# Patient Record
Sex: Female | Born: 1950 | Race: White | Hispanic: No | Smoking: Former smoker
Health system: Southern US, Community
[De-identification: ages and names within clinical notes are randomized; demographics above are authoritative.]

## PROBLEM LIST (undated history)

## (undated) DIAGNOSIS — T4145XA Adverse effect of unspecified anesthetic, initial encounter: Secondary | ICD-10-CM

## (undated) DIAGNOSIS — T8859XA Other complications of anesthesia, initial encounter: Secondary | ICD-10-CM

## (undated) DIAGNOSIS — M8580 Other specified disorders of bone density and structure, unspecified site: Secondary | ICD-10-CM

## (undated) DIAGNOSIS — E785 Hyperlipidemia, unspecified: Secondary | ICD-10-CM

## (undated) HISTORY — PX: TONSILECTOMY/ADENOIDECTOMY WITH MYRINGOTOMY: SHX6125

## (undated) HISTORY — DX: Hyperlipidemia, unspecified: E78.5

## (undated) HISTORY — PX: TUBAL LIGATION: SHX77

## (undated) HISTORY — PX: NECK SURGERY: SHX720

## (undated) HISTORY — DX: Other specified disorders of bone density and structure, unspecified site: M85.80

---

## 2006-05-03 ENCOUNTER — Other Ambulatory Visit: Admission: RE | Admit: 2006-05-03 | Discharge: 2006-05-03 | Payer: Self-pay | Admitting: Family Medicine

## 2012-06-15 ENCOUNTER — Other Ambulatory Visit: Payer: Self-pay | Admitting: Family Medicine

## 2012-06-15 DIAGNOSIS — R1011 Right upper quadrant pain: Secondary | ICD-10-CM

## 2012-06-19 ENCOUNTER — Ambulatory Visit (HOSPITAL_COMMUNITY): Payer: BC Managed Care – PPO

## 2012-10-16 ENCOUNTER — Other Ambulatory Visit: Payer: Self-pay | Admitting: *Deleted

## 2012-10-16 MED ORDER — OMEPRAZOLE 20 MG PO CPDR: 20.0000 mg | DELAYED_RELEASE_CAPSULE | Freq: Every day | ORAL | Status: DC

## 2012-12-05 ENCOUNTER — Telehealth: Payer: Self-pay

## 2013-01-10 ENCOUNTER — Encounter: Payer: Self-pay | Admitting: Nurse Practitioner

## 2013-01-10 ENCOUNTER — Ambulatory Visit (INDEPENDENT_AMBULATORY_CARE_PROVIDER_SITE_OTHER): Payer: BC Managed Care – PPO | Admitting: Nurse Practitioner

## 2013-01-10 VITALS — BP 99/60 | HR 66 | Temp 97.4°F | Ht 64.0 in | Wt 150.0 lb

## 2013-01-10 DIAGNOSIS — Z0000 Encounter for general adult medical examination without abnormal findings: Secondary | ICD-10-CM

## 2013-01-10 LAB — POCT CBC
Granulocyte percent: 58.6 %G (ref 37–80)
Lymph, poc: 2.9 (ref 0.6–3.4)
MCH, POC: 32.2 pg — AB (ref 27–31.2)
MCHC: 35.5 g/dL — AB (ref 31.8–35.4)
MCV: 90.7 fL (ref 80–97)
Platelet Count, POC: 256 10*3/uL (ref 142–424)
RDW, POC: 13 %
WBC: 7.9 10*3/uL (ref 4.6–10.2)

## 2013-01-10 NOTE — Progress Notes (Signed)
  Subjective:    Patient ID: Erica [name], female    DOB: 1951-04-07, 62 y.o.   MRN: [id number]  HPI Patient here today for annual physical exam- She ois doing well - No complaints- No current medical problems- No meds    Review of Systems  Constitutional: Negative.   HENT: Negative.   Respiratory: Negative.   Cardiovascular: Negative.   Gastrointestinal: Negative.   Genitourinary: Negative.   Musculoskeletal: Negative.   Neurological: Negative.   Hematological: Negative.   Psychiatric/Behavioral: Negative.        Objective:   Physical Exam  Constitutional: She is oriented to person, place, and time. She appears well-developed and well-nourished.  HENT:  Nose: Nose normal.  Mouth/Throat: Oropharynx is clear and moist.  Eyes: EOM are normal.  Neck: Trachea normal, normal range of motion and full passive range of motion without pain. Neck supple. No JVD present. Carotid bruit is not present. No thyromegaly present.  Cardiovascular: Normal rate, regular rhythm, normal heart sounds and intact distal pulses.  Exam reveals no gallop and no friction rub.   No murmur heard. Pulmonary/Chest: Effort normal and breath sounds normal.  Abdominal: Soft. Bowel sounds are normal. She exhibits no distension and no mass. There is no tenderness.  Musculoskeletal: Normal range of motion.  Lymphadenopathy:    She has no cervical adenopathy.  Neurological: She is alert and oriented to person, place, and time. She has normal reflexes.  Skin: Skin is warm and dry.  Psychiatric: She has a normal mood and affect. Her behavior is normal. Judgment and thought content normal.     BP 99/60  Pulse 66  Temp(Src) 97.4 F (36.3 C) (Oral)  Ht 5\' 4"  (1.626 m)  Wt 150 lb (68.04 kg)  BMI 25.73 kg/m2      Assessment & Plan:  1. Annual physical exam *Diet and exercsie encouraged - POCT CBC - COMPLETE METABOLIC PANEL WITH GFR - NMR Lipoprofile with Lipids - Thyroid Panel With TSH - Vitamin D 25  hydroxy  Mary-Margaret Daphine Deutscher, FNP

## 2013-01-10 NOTE — Patient Instructions (Signed)

## 2013-01-11 LAB — COMPLETE METABOLIC PANEL WITH GFR
AST: 24 U/L (ref 0–37)
Alkaline Phosphatase: 81 U/L (ref 39–117)
BUN: 21 mg/dL (ref 6–23)
Creat: 0.79 mg/dL (ref 0.50–1.10)
GFR, Est Non African American: 81 mL/min
Glucose, Bld: 91 mg/dL (ref 70–99)
Potassium: 4.2 mEq/L (ref 3.5–5.3)
Total Bilirubin: 0.2 mg/dL — ABNORMAL LOW (ref 0.3–1.2)

## 2013-01-11 LAB — THYROID PANEL WITH TSH
Free Thyroxine Index: 2.4 (ref 1.0–3.9)
T3 Uptake: 32.7 % (ref 22.5–37.0)

## 2013-01-12 LAB — NMR LIPOPROFILE WITH LIPIDS
Cholesterol, Total: 226 mg/dL — ABNORMAL HIGH (ref <200)
HDL Size: 8.8 nm — ABNORMAL LOW (ref >=9.2)
HDL-C: 49 mg/dL (ref >=40)
LDL (calc): 151 mg/dL — ABNORMAL HIGH (ref <100)
LDL Particle Number: 2079 nmol/L — ABNORMAL HIGH (ref <1000)
LP-IR Score: 41 (ref <=45)
Triglycerides: 132 mg/dL (ref <150)
VLDL Size: 40.7 nm (ref <=46.6)

## 2013-01-15 ENCOUNTER — Telehealth: Payer: Self-pay | Admitting: Nurse Practitioner

## 2013-01-15 NOTE — Telephone Encounter (Signed)
Details left on vm for pt.

## 2013-10-26 ENCOUNTER — Encounter: Payer: Self-pay | Admitting: *Deleted

## 2014-01-15 ENCOUNTER — Encounter: Payer: Self-pay | Admitting: Nurse Practitioner

## 2014-01-15 ENCOUNTER — Ambulatory Visit (INDEPENDENT_AMBULATORY_CARE_PROVIDER_SITE_OTHER): Payer: BC Managed Care – PPO | Admitting: Nurse Practitioner

## 2014-01-15 VITALS — BP 120/94 | HR 65 | Temp 98.1°F | Ht 64.0 in | Wt 156.6 lb

## 2014-01-15 DIAGNOSIS — Z13820 Encounter for screening for osteoporosis: Secondary | ICD-10-CM

## 2014-01-15 DIAGNOSIS — K219 Gastro-esophageal reflux disease without esophagitis: Secondary | ICD-10-CM

## 2014-01-15 DIAGNOSIS — E785 Hyperlipidemia, unspecified: Secondary | ICD-10-CM

## 2014-01-15 DIAGNOSIS — Z0000 Encounter for general adult medical examination without abnormal findings: Secondary | ICD-10-CM

## 2014-01-15 MED ORDER — OMEPRAZOLE 20 MG PO CPDR: 20.0000 mg | DELAYED_RELEASE_CAPSULE | Freq: Every day | ORAL | Status: DC

## 2014-01-15 NOTE — Patient Instructions (Signed)

## 2014-01-15 NOTE — Progress Notes (Signed)
Subjective:    Patient ID: Erica [name], female    DOB: [birth year], 63 y.o.   MRN: [id number]  Patient here tiday for CPE no pap- She is doing well today without complaints.  Hyperlipidemia This is a chronic problem. The current episode started more than 1 year ago. The problem is controlled. There are no known factors aggravating her hyperlipidemia. Current antihyperlipidemic treatment includes fibric acid derivatives and diet change. The current treatment provides mild improvement of lipids. There are no compliance problems.  Risk factors for coronary artery disease include post-menopausal.  GERD: Patient is taking prilosec 80m daily which she reports is helping.     Review of Systems  Constitutional: Negative.   HENT: Negative.   Eyes: Negative.   Respiratory: Negative.   Cardiovascular: Negative.   Gastrointestinal: Negative.   Endocrine: Negative.   Genitourinary: Negative.   Musculoskeletal: Negative.   Skin: Negative.   Allergic/Immunologic: Negative.   Neurological: Negative.   Hematological: Negative.   Psychiatric/Behavioral: Negative.        Objective:   Physical Exam  Constitutional: She is oriented to person, place, and time. She appears well-developed and well-nourished.  HENT:  Head: Normocephalic.  Eyes: Pupils are equal, round, and reactive to light.  Neck: Normal range of motion.  Cardiovascular: Normal rate.   Pulmonary/Chest: Effort normal.  Abdominal: Soft.  Musculoskeletal: Normal range of motion.  Neurological: She is alert and oriented to person, place, and time.  Skin: Skin is warm.  Psychiatric: She has a normal mood and affect.   BP 120/94  Pulse 65  Temp(Src) 98.1 F (36.7 C) (Oral)  Ht _0  (1.626 m)  Wt 156 lb 9.6 oz (71.033 kg)  BMI 26.87 kg/m2        Assessment & Plan:   1. Gastroesophageal reflux disease without esophagitis   2. Annual physical exam   3. Other and unspecified hyperlipidemia   4. Screening for  osteoporosis    Orders Placed This Encounter  Procedures  . DG Bone Density    Standing Status: Future     Number of Occurrences:      Standing Expiration Date: 03/17/2015    Order Specific Question:  Reason for Exam (SYMPTOM  OR DIAGNOSIS REQUIRED)    Answer:  screening    Order Specific Question:  Preferred imaging location?    Answer:  Internal  . NMR, lipoprofile  . CMP14+EGFR  . CBC With differential/Platelet  . Thyroid Panel With TSH   Meds ordered this encounter  Medications  . DISCONTD: omeprazole (PRILOSEC) 20 MG capsule    Sig: Take 20 mg by mouth daily.  . calcium citrate-vitamin D (CITRACAL+D) 315-200 MG-UNIT per tablet    Sig: Take 1 tablet by mouth 2 (two) times daily.  .Marland Kitchenaspirin 81 MG tablet    Sig: Take 81 mg by mouth daily.  . Ascorbic Acid (VITAMIN C) 1000 MG tablet    Sig: Take 1,000 mg by mouth daily.  . cholecalciferol (VITAMIN D) 400 UNITS TABS tablet    Sig: Take 400 Units by mouth.  .Marland Kitchenomeprazole (PRILOSEC) 20 MG capsule    Sig: Take 1 capsule (20 mg total) by mouth daily.    Dispense:  90 capsule    Refill:  1    Order Specific Question:  Supervising Provider    Answer:  MChipper Herb[[id number]]   hemoccult cards given to patient with directions Continue all  Health maintenance reviewed Follow up  In 6 Months  Mary-Margaret Hassell Done, FNP

## 2014-01-16 ENCOUNTER — Other Ambulatory Visit: Payer: Self-pay | Admitting: Nurse Practitioner

## 2014-01-16 ENCOUNTER — Telehealth: Payer: Self-pay | Admitting: Family Medicine

## 2014-01-16 LAB — CBC WITH DIFFERENTIAL
Basophils Absolute: 0 10*3/uL (ref 0.0–0.2)
Basos: 0 %
EOS: 2 %
Eosinophils Absolute: 0.2 10*3/uL (ref 0.0–0.4)
HEMATOCRIT: 41.5 % (ref 34.0–46.6)
Hemoglobin: 14.1 g/dL (ref 11.1–15.9)
IMMATURE GRANULOCYTES: 0 %
Immature Grans (Abs): 0 10*3/uL (ref 0.0–0.1)
LYMPHS ABS: 2 10*3/uL (ref 0.7–3.1)
Lymphs: 25 %
MCH: 30.8 pg (ref 26.6–33.0)
MCHC: 34 g/dL (ref 31.5–35.7)
MCV: 91 fL (ref 79–97)
MONOS ABS: 0.7 10*3/uL (ref 0.1–0.9)
Monocytes: 9 %
Neutrophils Absolute: 4.9 10*3/uL (ref 1.4–7.0)
Neutrophils Relative %: 64 %
PLATELETS: 295 10*3/uL (ref 150–379)
RBC: 4.58 x10E6/uL (ref 3.77–5.28)
RDW: 13.8 % (ref 12.3–15.4)
WBC: 7.7 10*3/uL (ref 3.4–10.8)

## 2014-01-16 LAB — CMP14+EGFR
ALBUMIN: 4.4 g/dL (ref 3.6–4.8)
ALT: 33 IU/L — ABNORMAL HIGH (ref 0–32)
AST: 31 IU/L (ref 0–40)
Albumin/Globulin Ratio: 2 (ref 1.1–2.5)
Alkaline Phosphatase: 83 IU/L (ref 39–117)
BILIRUBIN TOTAL: 0.4 mg/dL (ref 0.0–1.2)
BUN/Creatinine Ratio: 15 (ref 11–26)
BUN: 11 mg/dL (ref 8–27)
CO2: 27 mmol/L (ref 18–29)
CREATININE: 0.74 mg/dL (ref 0.57–1.00)
Calcium: 9.6 mg/dL (ref 8.7–10.3)
Chloride: 101 mmol/L (ref 97–108)
GFR calc non Af Amer: 87 mL/min/{1.73_m2} (ref >59)
GFR, EST AFRICAN AMERICAN: 100 mL/min/{1.73_m2} (ref >59)
GLOBULIN, TOTAL: 2.2 g/dL (ref 1.5–4.5)
Glucose: 89 mg/dL (ref 65–99)
Potassium: 4.6 mmol/L (ref 3.5–5.2)
Sodium: 140 mmol/L (ref 134–144)
TOTAL PROTEIN: 6.6 g/dL (ref 6.0–8.5)

## 2014-01-16 LAB — NMR, LIPOPROFILE
Cholesterol: 230 mg/dL — ABNORMAL HIGH (ref 100–199)
HDL CHOLESTEROL BY NMR: 55 mg/dL (ref >39)
HDL Particle Number: 35.6 umol/L (ref >=30.5)
LDL PARTICLE NUMBER: 1840 nmol/L — AB (ref <1000)
LDL Size: 21.4 nm (ref >20.5)
LDLC SERPL CALC-MCNC: 153 mg/dL — AB (ref 0–99)
LP-IR Score: 47 — ABNORMAL HIGH (ref <=45)
Small LDL Particle Number: 426 nmol/L (ref <=527)
TRIGLYCERIDES BY NMR: 110 mg/dL (ref 0–149)

## 2014-01-16 LAB — THYROID PANEL WITH TSH
FREE THYROXINE INDEX: 1.8 (ref 1.2–4.9)
T3 UPTAKE RATIO: 30 % (ref 24–39)
T4 TOTAL: 6.1 ug/dL (ref 4.5–12.0)
TSH: 4.17 u[IU]/mL (ref 0.450–4.500)

## 2014-01-16 MED ORDER — ATORVASTATIN CALCIUM 40 MG PO TABS: 40.0000 mg | ORAL_TABLET | Freq: Every day | ORAL | Status: DC

## 2014-01-16 NOTE — Telephone Encounter (Signed)
Message copied by Waverly Ferrari on Wed Jan 16, 2014  2:47 PM ------      Message from: Chevis Pretty      Created: Wed Jan 16, 2014  1:22 PM       ldl extremely- needs to be on statin-  What symptoms did have with last statin?      Kidney and liver function stable      Anemia panel normal      Thyroid normal      Continue current meds- low fat diet and exercise and recheck in 3 months             ------

## 2014-01-17 ENCOUNTER — Telehealth: Payer: Self-pay | Admitting: Family Medicine

## 2014-01-17 NOTE — Telephone Encounter (Signed)
Message copied by Waverly Ferrari on Thu Jan 17, 2014  2:45 PM ------      Message from: Chevis Pretty      Created: Thu Jan 17, 2014  1:13 PM       Strict low fat diet and exercise if can't tolerate statin ------

## 2014-02-13 ENCOUNTER — Ambulatory Visit: Payer: BC Managed Care – PPO

## 2014-02-13 ENCOUNTER — Ambulatory Visit (INDEPENDENT_AMBULATORY_CARE_PROVIDER_SITE_OTHER): Payer: BC Managed Care – PPO

## 2014-02-13 DIAGNOSIS — Z13820 Encounter for screening for osteoporosis: Secondary | ICD-10-CM

## 2014-02-13 LAB — HM DEXA SCAN

## 2014-03-11 ENCOUNTER — Encounter: Payer: Self-pay | Admitting: Pharmacist

## 2014-03-11 ENCOUNTER — Ambulatory Visit (INDEPENDENT_AMBULATORY_CARE_PROVIDER_SITE_OTHER): Payer: BC Managed Care – PPO | Admitting: Pharmacist

## 2014-03-11 VITALS — BP 122/82 | HR 70 | Ht 63.5 in | Wt 158.0 lb

## 2014-03-11 DIAGNOSIS — M899 Disorder of bone, unspecified: Secondary | ICD-10-CM

## 2014-03-11 DIAGNOSIS — M949 Disorder of cartilage, unspecified: Secondary | ICD-10-CM

## 2014-03-11 DIAGNOSIS — M8580 Other specified disorders of bone density and structure, unspecified site: Secondary | ICD-10-CM

## 2014-03-11 NOTE — Patient Instructions (Signed)
Exercise for Strong Bones  Exercise is important to build and maintain strong bones / bone density.  There are 2 types of exercises that are important to building and maintaining strong bones:  Weight- bearing and muscle-stregthening.  Weight-bearing Exercises  These exercises include activities that make you move against gravity while staying upright. Weight-bearing exercises can be high-impact or low-impact.  High-impact weight-bearing exercises help build bones and keep them strong. If you have broken a bone due to osteoporosis or are at risk of breaking a bone, you may need to avoid high-impact exercises. If you're not sure, you should check with your healthcare provider.  Examples of high-impact weight-bearing exercises are: Dancing  Doing high-impact aerobics  Hiking  Jogging/running  Jumping Rope  Stair climbing  Tennis  Low-impact weight-bearing exercises can also help keep bones strong and are a safe alternative if you cannot do high-impact exercises.   Examples of low-impact weight-bearing exercises are: Using elliptical training machines  Doing low-impact aerobics  Using stair-step machines  Fast walking on a treadmill or outside   Muscle-Strengthening Exercises These exercises include activities where you move your body, a weight or some other resistance against gravity. They are also known as resistance exercises and include: Lifting weights  Using elastic exercise bands  Using weight machines  Lifting your own body weight  Functional movements, such as standing and rising up on your toes  Yoga and Pilates can also improve strength, balance and flexibility. However, certain positions may not be safe for people with osteoporosis or those at increased risk of broken bones. For example, exercises that have you bend forward may increase the chance of breaking a bone in the spine.   Non-Impact Exercises There are other types of exercises that can help prevent falls.   Non-impact exercises can help you to improve balance, posture and how well you move in everyday activities. Some of these exercises include: Balance exercises that strengthen your legs and test your balance, such as Tai Chi, can decrease your risk of falls.  Posture exercises that improve your posture and reduce rounded or "sloping" shoulders can help you decrease the chance of breaking a bone, especially in the spine.  Functional exercises that improve how well you move can help you with everyday activities and decrease your chance of falling and breaking a bone. For example, if you have trouble getting up from a chair or climbing stairs, you should do these activities as exercises.   **A physical therapist can teach you balance, posture and functional exercises. He/she can also help you learn which exercises are safe and appropriate for you.  [facility] has a physical therapy office in [place] in front of our office and referrals can be made for assessments and treatment as needed and strength and balance training.  If you would like to have an assessment with [facility] and our physical therapy team please let a nurse or provider know.    

## 2014-03-11 NOTE — Progress Notes (Signed)
Osteoporosis Clinic Current Height: Height: 5' 3.5" (161.3 cm)      Max Lifetime Height:  5\' 4"  Current Weight: Weight: 158 lb (71.668 kg)       Ethnicity:Caucasian   HPI: Does pt already have a diagnosis of:  Osteopenia?  Yes Osteoporosis?  No  Back Pain?  No       Kyphosis?  No Prior fracture?  No Med(s) for Osteoporosis/Osteopenia:  none Med(s) previously tried for Osteoporosis/Osteopenia:  none                                                             PMH: Age at menopause:  35 or 63yo Hysterectomy?  No Oophorectomy?  No HRT? No Steroid Use?  No Thyroid med?  No History of cancer?  Yes - skin cancer History of digestive disorders (ie Crohn's)?  Yes - GERD on chronic PPI Current or previous eating disorders?  No Last Vitamin D Result:  53 (12/2012) Last GFR Result:  87 (12/2013)   FH/SH: Family history of osteoporosis?  Yes - mother Parent with history of hip fracture?  No Family history of breast cancer?  No Exercise?  No Smoking?  Not currently quit in 2009 Alcohol?  No    Calcium Assessment Calcium Intake  # of servings/day  Calcium mg  Milk (8 oz) 0  x  300  = 0  Yogurt (4 oz) 0 x  200 = 0  Cheese (1 oz) 1 x  200 = 200mg   Other Calcium sources   250mg   Ca supplement 500mg  qd = 500mg    Estimated calcium intake per day 950mg     DEXA Results Date of Test T-Score for AP Spine L1-L4 T-Score for Total Left Hip T-Score for Total Right Hip  02/13/2014 0.1 -1.6 -1.8  11/11/2010 -0.1 -1.4 -1.3  09/06/2007 -0.1 -1.1 -1.4        FRAX 10 year estimate: Total FX risk:  9.6%  (consider medication if >/= 20%) Hip FX risk:  1.1%  (consider medication if >/= 3%)  Assessment: Osteopenia with decreased BMD in hips  Recommendations: 1.  Discussed BMD results and fracture risk 2.  recommend calcium 1200mg  daily through supplementation or diet.  3.  recommend weight bearing exercise - 30 minutes at least 4 days per week.  Patient to look into exercise at work or  with daughter. 4.  Counseled and educated about fall risk and prevention.  Recheck DEXA:  2 years  Time spent counseling patient:  20 minutes

## 2014-03-19 ENCOUNTER — Other Ambulatory Visit: Payer: Self-pay | Admitting: Nurse Practitioner

## 2014-04-10 ENCOUNTER — Telehealth: Payer: Self-pay | Admitting: Nurse Practitioner

## 2014-04-10 DIAGNOSIS — K8012 Calculus of gallbladder with acute and chronic cholecystitis without obstruction: Secondary | ICD-10-CM

## 2014-04-10 NOTE — Telephone Encounter (Signed)
You saw her on 01-18-14. Does she need to come back in or can referral be made. Please advise

## 2014-04-11 ENCOUNTER — Encounter: Payer: Self-pay | Admitting: Family Medicine

## 2014-04-11 ENCOUNTER — Encounter (INDEPENDENT_AMBULATORY_CARE_PROVIDER_SITE_OTHER): Payer: Self-pay

## 2014-04-11 ENCOUNTER — Ambulatory Visit (INDEPENDENT_AMBULATORY_CARE_PROVIDER_SITE_OTHER): Payer: BC Managed Care – PPO | Admitting: Family Medicine

## 2014-04-11 VITALS — BP 133/73 | HR 64 | Temp 97.7°F | Ht 63.5 in | Wt 153.8 lb

## 2014-04-11 DIAGNOSIS — K8000 Calculus of gallbladder with acute cholecystitis without obstruction: Secondary | ICD-10-CM

## 2014-04-11 NOTE — Telephone Encounter (Signed)
Patient aware.

## 2014-04-11 NOTE — Progress Notes (Signed)
   Subjective:    Patient ID: Erica [name], female    DOB: February 08, 1951, 63 y.o.   MRN: [id number]  HPI C/o gallbladder attack on 04/09/14 and was seen in the ED at [facility].  She had elevated LFT's, and cholelithiasis w/o cholecystitis.  She ate a hot dog earlier that day and that kicked off colicky RUQ abdominal pain.  She did have a UTI and is on abx's.   Review of Systems    No chest pain, SOB, HA, dizziness, vision change, N/V, diarrhea, constipation, dysuria, urinary urgency or frequency, myalgias, arthralgias or rash.  Objective:   Physical Exam  Vital signs noted  Well developed well nourished female.  HEENT - Head atraumatic Normocephalic Respiratory - Lungs CTA bilateral Cardiac - RRR S1 and S2 without murmur GI - Abdomen soft Nontender and bowel sounds active x 4 Extremities - No edema. Neuro - Grossly intact.      Assessment & Plan:  Calculus of gallbladder with acute cholecystitis without obstruction - Plan: Ambulatory referral to [facility] FNP

## 2014-04-11 NOTE — Telephone Encounter (Signed)
Referral made 

## 2014-05-08 NOTE — Pre-Procedure Instructions (Signed)
SHARMANE DAME  05/08/2014   Your procedure is scheduled on: Tuesday, May 14, 2014   Report to [facility] Admitting at 10:30 AM.   Call this number if you have problems the morning of surgery: [phone]   Remember:   Do not eat food or drink liquids after midnight Monday, May 13, 2014    Take these medicines the morning of surgery with A SIP OF WATER: if needed:HYDROcodone for pain  Stop taking Aspirin, vitamins, and herbal medications (Flax seed oil, Krill oil).  Do not take any NSAIDs ie: Ibuprofen, Advil, Naproxen or any medication containing Aspirin.   Do not wear jewelry, make-up or nail polish.  Do not wear lotions, powders, or perfumes. You may not wear deodorant.  Do not shave 48 hours prior to surgery.   Do not bring valuables to the hospital.  [facility] is not responsible for any belongings or valuables.               Contacts, dentures or bridgework may not be worn into surgery.  Leave suitcase in the car. After surgery it may be brought to your room.  For patients admitted to the hospital, discharge time is determined by your treatment team.               Patients discharged the day of surgery will not be allowed to drive home.   Name and phone number of your driver:    Special Instructions:  Special Instructions:Special Instructions: [facility] - Preparing for Surgery  Before surgery, you can play an important role.  Because skin is not sterile, your skin needs to be as free of germs as possible.  You can reduce the number of germs on you skin by washing with CHG (chlorahexidine gluconate) soap before surgery.  CHG is an antiseptic cleaner which kills germs and bonds with the skin to continue killing germs even after washing.  Please DO NOT use if you have an allergy to CHG or antibacterial soaps.  If your skin becomes reddened/irritated stop using the CHG and inform your nurse when you arrive at Short Stay.  Do not shave (including legs  and underarms) for at least 48 hours prior to the first CHG shower.  You may shave your face.  Please follow these instructions carefully:   1.  Shower with CHG Soap the night before surgery and the morning of Surgery.  2.  If you choose to wash your hair, wash your hair first as usual with your normal shampoo.  3.  After you shampoo, rinse your hair and body thoroughly to remove the Shampoo.  4.  Use CHG as you would any other liquid soap.  You can apply chg directly  to the skin and wash gently with scrungie or a clean washcloth.  5.  Apply the CHG Soap to your body ONLY FROM THE NECK DOWN.  Do not use on open wounds or open sores.  Avoid contact with your eyes, ears, mouth and genitals (private parts).  Wash genitals (private parts) with your normal soap.  6.  Wash thoroughly, paying special attention to the area where your surgery will be performed.  7.  Thoroughly rinse your body with warm water from the neck down.  8.  DO NOT shower/wash with your normal soap after using and rinsing off the CHG Soap.  9.  Pat yourself dry with a clean towel.            10.  Wear clean pajamas.            11.  Place clean sheets on your bed the night of your first shower and do not sleep with pets.  Day of Surgery  Do not apply any lotions/deodorants the morning of surgery.  Please wear clean clothes to the hospital/surgery center.   Please read over the following fact sheets that you were given: Pain Booklet, Coughing and Deep Breathing and Surgical Site Infection Prevention

## 2014-05-09 ENCOUNTER — Encounter (HOSPITAL_COMMUNITY)
Admission: RE | Admit: 2014-05-09 | Discharge: 2014-05-09 | Disposition: A | Discharge status: Home / Self Care | Payer: BC Managed Care – PPO | Source: Ambulatory Visit | Attending: General Surgery | Admitting: General Surgery

## 2014-05-09 ENCOUNTER — Encounter (HOSPITAL_COMMUNITY): Payer: Self-pay

## 2014-05-09 ENCOUNTER — Other Ambulatory Visit (INDEPENDENT_AMBULATORY_CARE_PROVIDER_SITE_OTHER): Payer: Self-pay | Admitting: General Surgery

## 2014-05-09 DIAGNOSIS — R7989 Other specified abnormal findings of blood chemistry: Secondary | ICD-10-CM | POA: Diagnosis not present

## 2014-05-09 DIAGNOSIS — K8010 Calculus of gallbladder with chronic cholecystitis without obstruction: Secondary | ICD-10-CM | POA: Diagnosis not present

## 2014-05-09 DIAGNOSIS — Z01812 Encounter for preprocedural laboratory examination: Secondary | ICD-10-CM | POA: Insufficient documentation

## 2014-05-09 HISTORY — DX: Adverse effect of unspecified anesthetic, initial encounter: T41.45XA

## 2014-05-09 HISTORY — DX: Other complications of anesthesia, initial encounter: T88.59XA

## 2014-05-09 LAB — CBC
HEMATOCRIT: 42.2 % (ref 36.0–46.0)
HEMOGLOBIN: 14.5 g/dL (ref 12.0–15.0)
MCH: 30.7 pg (ref 26.0–34.0)
MCHC: 34.4 g/dL (ref 30.0–36.0)
MCV: 89.2 fL (ref 78.0–100.0)
Platelets: 299 10*3/uL (ref 150–400)
RBC: 4.73 MIL/uL (ref 3.87–5.11)
RDW: 13 % (ref 11.5–15.5)
WBC: 6.5 10*3/uL (ref 4.0–10.5)

## 2014-05-13 MED ORDER — CEFOXITIN SODIUM 2 G IV SOLR
2.0000 g | INTRAVENOUS | Status: AC
  Administered 2014-05-14: 2 g via INTRAVENOUS
  Filled 2014-05-13 (×2): qty 2

## 2014-05-14 ENCOUNTER — Ambulatory Visit (HOSPITAL_COMMUNITY)
Admission: RE | Admit: 2014-05-14 | Discharge: 2014-05-14 | Disposition: A | Discharge status: Home / Self Care | Payer: BC Managed Care – PPO | Source: Ambulatory Visit | Attending: General Surgery | Admitting: General Surgery

## 2014-05-14 ENCOUNTER — Ambulatory Visit (HOSPITAL_COMMUNITY): Payer: BC Managed Care – PPO | Admitting: Anesthesiology

## 2014-05-14 ENCOUNTER — Encounter (HOSPITAL_COMMUNITY)
Admission: RE | Disposition: A | Discharge status: Home / Self Care | Payer: Self-pay | Source: Ambulatory Visit | Attending: General Surgery

## 2014-05-14 ENCOUNTER — Ambulatory Visit (HOSPITAL_COMMUNITY): Payer: BC Managed Care – PPO

## 2014-05-14 ENCOUNTER — Encounter (HOSPITAL_COMMUNITY): Payer: Self-pay | Admitting: *Deleted

## 2014-05-14 DIAGNOSIS — Z888 Allergy status to other drugs, medicaments and biological substances status: Secondary | ICD-10-CM | POA: Diagnosis not present

## 2014-05-14 DIAGNOSIS — E780 Pure hypercholesterolemia: Secondary | ICD-10-CM | POA: Diagnosis not present

## 2014-05-14 DIAGNOSIS — K8010 Calculus of gallbladder with chronic cholecystitis without obstruction: Secondary | ICD-10-CM | POA: Diagnosis not present

## 2014-05-14 DIAGNOSIS — Z419 Encounter for procedure for purposes other than remedying health state, unspecified: Secondary | ICD-10-CM

## 2014-05-14 DIAGNOSIS — R7989 Other specified abnormal findings of blood chemistry: Secondary | ICD-10-CM | POA: Diagnosis not present

## 2014-05-14 DIAGNOSIS — K219 Gastro-esophageal reflux disease without esophagitis: Secondary | ICD-10-CM | POA: Insufficient documentation

## 2014-05-14 DIAGNOSIS — Z87891 Personal history of nicotine dependence: Secondary | ICD-10-CM | POA: Diagnosis not present

## 2014-05-14 DIAGNOSIS — K8020 Calculus of gallbladder without cholecystitis without obstruction: Secondary | ICD-10-CM | POA: Diagnosis present

## 2014-05-14 HISTORY — PX: CHOLECYSTECTOMY: SHX55

## 2014-05-14 LAB — COMPREHENSIVE METABOLIC PANEL
ALT: 22 U/L (ref 0–35)
ANION GAP: 12 (ref 5–15)
AST: 23 U/L (ref 0–37)
Albumin: 3.7 g/dL (ref 3.5–5.2)
Alkaline Phosphatase: 84 U/L (ref 39–117)
BUN: 16 mg/dL (ref 6–23)
CALCIUM: 9.3 mg/dL (ref 8.4–10.5)
CO2: 24 mEq/L (ref 19–32)
CREATININE: 0.74 mg/dL (ref 0.50–1.10)
Chloride: 105 mEq/L (ref 96–112)
GFR calc non Af Amer: 89 mL/min — ABNORMAL LOW (ref >90)
GLUCOSE: 89 mg/dL (ref 70–99)
Potassium: 4.4 mEq/L (ref 3.7–5.3)
Sodium: 141 mEq/L (ref 137–147)
TOTAL PROTEIN: 6.5 g/dL (ref 6.0–8.3)
Total Bilirubin: 0.3 mg/dL (ref 0.3–1.2)

## 2014-05-14 SURGERY — LAPAROSCOPIC CHOLECYSTECTOMY WITH INTRAOPERATIVE CHOLANGIOGRAM
Anesthesia: General | Site: Abdomen

## 2014-05-14 MED ORDER — FENTANYL CITRATE 0.05 MG/ML IJ SOLN
INTRAMUSCULAR | Status: AC
  Filled 2014-05-14: qty 5

## 2014-05-14 MED ORDER — PROPOFOL 10 MG/ML IV BOLUS
INTRAVENOUS | Status: DC | PRN
  Administered 2014-05-14: 180 mg via INTRAVENOUS

## 2014-05-14 MED ORDER — MEPERIDINE HCL 25 MG/ML IJ SOLN: 6.2500 mg | INTRAMUSCULAR | Status: DC | PRN

## 2014-05-14 MED ORDER — LACTATED RINGERS IV SOLN
INTRAVENOUS | Status: DC | PRN
  Administered 2014-05-14 (×2): via INTRAVENOUS

## 2014-05-14 MED ORDER — LIDOCAINE HCL (CARDIAC) 20 MG/ML IV SOLN
INTRAVENOUS | Status: DC | PRN
  Administered 2014-05-14: 100 mg via INTRAVENOUS

## 2014-05-14 MED ORDER — 0.9 % SODIUM CHLORIDE (POUR BTL) OPTIME
TOPICAL | Status: DC | PRN
  Administered 2014-05-14: 1000 mL

## 2014-05-14 MED ORDER — OXYCODONE HCL 5 MG PO TABS
ORAL_TABLET | ORAL | Status: DC
Start: 2014-05-14 — End: 2014-05-14
  Filled 2014-05-14: qty 1

## 2014-05-14 MED ORDER — PROPOFOL 10 MG/ML IV BOLUS
INTRAVENOUS | Status: AC
  Filled 2014-05-14: qty 20

## 2014-05-14 MED ORDER — ARTIFICIAL TEARS OP OINT
TOPICAL_OINTMENT | OPHTHALMIC | Status: AC
  Filled 2014-05-14: qty 3.5

## 2014-05-14 MED ORDER — FENTANYL CITRATE 0.05 MG/ML IJ SOLN
INTRAMUSCULAR | Status: AC
  Filled 2014-05-14: qty 2

## 2014-05-14 MED ORDER — ROCURONIUM BROMIDE 50 MG/5ML IV SOLN
INTRAVENOUS | Status: AC
  Filled 2014-05-14: qty 1

## 2014-05-14 MED ORDER — ONDANSETRON HCL 4 MG/2ML IJ SOLN
INTRAMUSCULAR | Status: DC | PRN
  Administered 2014-05-14: 4 mg via INTRAVENOUS

## 2014-05-14 MED ORDER — MIDAZOLAM HCL 5 MG/5ML IJ SOLN
INTRAMUSCULAR | Status: DC | PRN
  Administered 2014-05-14: 2 mg via INTRAVENOUS

## 2014-05-14 MED ORDER — NEOSTIGMINE METHYLSULFATE 10 MG/10ML IV SOLN
INTRAVENOUS | Status: AC
  Filled 2014-05-14: qty 1

## 2014-05-14 MED ORDER — HYDROCODONE-ACETAMINOPHEN 5-325 MG PO TABS: 1.0000 | ORAL_TABLET | Freq: Four times a day (QID) | ORAL | Status: DC | PRN

## 2014-05-14 MED ORDER — GLYCOPYRROLATE 0.2 MG/ML IJ SOLN
INTRAMUSCULAR | Status: AC
  Filled 2014-05-14: qty 3

## 2014-05-14 MED ORDER — ROCURONIUM BROMIDE 100 MG/10ML IV SOLN
INTRAVENOUS | Status: DC | PRN
  Administered 2014-05-14: 10 mg via INTRAVENOUS
  Administered 2014-05-14: 30 mg via INTRAVENOUS

## 2014-05-14 MED ORDER — IOHEXOL 300 MG/ML  SOLN
INTRAMUSCULAR | Status: DC | PRN
  Administered 2014-05-14: 16 mL

## 2014-05-14 MED ORDER — LIDOCAINE HCL (CARDIAC) 20 MG/ML IV SOLN
INTRAVENOUS | Status: AC
  Filled 2014-05-14: qty 5

## 2014-05-14 MED ORDER — SODIUM CHLORIDE 0.9 % IR SOLN
Status: DC | PRN
Start: 2014-05-14 — End: 2014-05-14
  Administered 2014-05-14: 1000 mL

## 2014-05-14 MED ORDER — STERILE WATER FOR INJECTION IJ SOLN
INTRAMUSCULAR | Status: AC
  Filled 2014-05-14: qty 10

## 2014-05-14 MED ORDER — LACTATED RINGERS IV SOLN
Freq: Once | INTRAVENOUS | Status: AC
  Administered 2014-05-14: 11:00:00 via INTRAVENOUS

## 2014-05-14 MED ORDER — FENTANYL CITRATE 0.05 MG/ML IJ SOLN
25.0000 ug | INTRAMUSCULAR | Status: DC | PRN
  Administered 2014-05-14: 25 ug via INTRAVENOUS

## 2014-05-14 MED ORDER — KETOROLAC TROMETHAMINE 30 MG/ML IJ SOLN
INTRAMUSCULAR | Status: DC | PRN
  Administered 2014-05-14: 30 mg via INTRAVENOUS

## 2014-05-14 MED ORDER — GLYCOPYRROLATE 0.2 MG/ML IJ SOLN
INTRAMUSCULAR | Status: DC | PRN
  Administered 2014-05-14: 0.6 mg via INTRAVENOUS

## 2014-05-14 MED ORDER — MIDAZOLAM HCL 2 MG/2ML IJ SOLN
INTRAMUSCULAR | Status: AC
  Filled 2014-05-14: qty 2

## 2014-05-14 MED ORDER — NEOSTIGMINE METHYLSULFATE 10 MG/10ML IV SOLN
INTRAVENOUS | Status: DC | PRN
  Administered 2014-05-14: 4 mg via INTRAVENOUS

## 2014-05-14 MED ORDER — PROMETHAZINE HCL 25 MG/ML IJ SOLN: 6.2500 mg | INTRAMUSCULAR | Status: DC | PRN

## 2014-05-14 MED ORDER — EPHEDRINE SULFATE 50 MG/ML IJ SOLN
INTRAMUSCULAR | Status: AC
  Filled 2014-05-14: qty 1

## 2014-05-14 MED ORDER — BUPIVACAINE-EPINEPHRINE 0.25% -1:200000 IJ SOLN
INTRAMUSCULAR | Status: DC | PRN
  Administered 2014-05-14: 30 mL

## 2014-05-14 MED ORDER — SUCCINYLCHOLINE CHLORIDE 20 MG/ML IJ SOLN
INTRAMUSCULAR | Status: AC
  Filled 2014-05-14: qty 1

## 2014-05-14 MED ORDER — OXYCODONE HCL 5 MG PO TABS
5.0000 mg | ORAL_TABLET | ORAL | Status: DC | PRN
  Administered 2014-05-14: 5 mg via ORAL

## 2014-05-14 MED ORDER — FENTANYL CITRATE 0.05 MG/ML IJ SOLN
INTRAMUSCULAR | Status: DC | PRN
  Administered 2014-05-14 (×5): 50 ug via INTRAVENOUS

## 2014-05-14 MED ORDER — ONDANSETRON HCL 4 MG/2ML IJ SOLN
INTRAMUSCULAR | Status: AC
  Filled 2014-05-14: qty 2

## 2014-05-14 MED ORDER — BUPIVACAINE-EPINEPHRINE (PF) 0.25% -1:200000 IJ SOLN
INTRAMUSCULAR | Status: AC
  Filled 2014-05-14: qty 30

## 2014-05-14 MED ORDER — DEXAMETHASONE SODIUM PHOSPHATE 4 MG/ML IJ SOLN
INTRAMUSCULAR | Status: DC | PRN
  Administered 2014-05-14: 8 mg via INTRAVENOUS

## 2014-05-14 MED ORDER — DIPHENHYDRAMINE HCL 50 MG/ML IJ SOLN
INTRAMUSCULAR | Status: DC | PRN
  Administered 2014-05-14: 12.5 mg via INTRAVENOUS

## 2014-05-14 SURGICAL SUPPLY — 49 items
APL SKNCLS STERI-STRIP NONHPOA (GAUZE/BANDAGES/DRESSINGS) ×1
APPLIER CLIP 5 13 M/L LIGAMAX5 (MISCELLANEOUS) ×2
APR CLP MED LRG 5 ANG JAW (MISCELLANEOUS) ×1
BAG SPEC RTRVL LRG 6X4 10 (ENDOMECHANICALS) ×1
BANDAGE ADH SHEER 1  50/CT (GAUZE/BANDAGES/DRESSINGS) ×6 IMPLANT
BENZOIN TINCTURE PRP APPL 2/3 (GAUZE/BANDAGES/DRESSINGS) ×2 IMPLANT
CANISTER SUCTION 2500CC (MISCELLANEOUS) ×2 IMPLANT
CHLORAPREP W/TINT 26ML (MISCELLANEOUS) ×2 IMPLANT
CLIP APPLIE 5 13 M/L LIGAMAX5 (MISCELLANEOUS) ×1 IMPLANT
CLSR STERI-STRIP ANTIMIC 1/2X4 (GAUZE/BANDAGES/DRESSINGS) ×1 IMPLANT
COVER MAYO STAND STRL (DRAPES) ×2 IMPLANT
COVER SURGICAL LIGHT HANDLE (MISCELLANEOUS) ×2 IMPLANT
DRAPE C-ARM 42X72 X-RAY (DRAPES) ×2 IMPLANT
DRAPE LAPAROSCOPIC ABDOMINAL (DRAPES) ×2 IMPLANT
DRSG TEGADERM 4X4.75 (GAUZE/BANDAGES/DRESSINGS) ×2 IMPLANT
ELECT REM PT RETURN 9FT ADLT (ELECTROSURGICAL) ×2
ELECTRODE REM PT RTRN 9FT ADLT (ELECTROSURGICAL) ×1 IMPLANT
GAUZE SPONGE 2X2 8PLY STRL LF (GAUZE/BANDAGES/DRESSINGS) ×1 IMPLANT
GLOVE BIO SURGEON STRL SZ8 (GLOVE) ×1 IMPLANT
GLOVE BIOGEL M STRL SZ7.5 (GLOVE) ×2 IMPLANT
GLOVE BIOGEL PI IND STRL 7.0 (GLOVE) IMPLANT
GLOVE BIOGEL PI IND STRL 8 (GLOVE) ×1 IMPLANT
GLOVE BIOGEL PI INDICATOR 7.0 (GLOVE) ×2
GLOVE BIOGEL PI INDICATOR 8 (GLOVE) ×2
GLOVE SURG SS PI 7.0 STRL IVOR (GLOVE) ×1 IMPLANT
GOWN STRL REUS W/ TWL LRG LVL3 (GOWN DISPOSABLE) ×3 IMPLANT
GOWN STRL REUS W/ TWL XL LVL3 (GOWN DISPOSABLE) ×1 IMPLANT
GOWN STRL REUS W/TWL LRG LVL3 (GOWN DISPOSABLE) ×6
GOWN STRL REUS W/TWL XL LVL3 (GOWN DISPOSABLE) ×2
KIT BASIN OR (CUSTOM PROCEDURE TRAY) ×2 IMPLANT
KIT ROOM TURNOVER OR (KITS) ×2 IMPLANT
NS IRRIG 1000ML POUR BTL (IV SOLUTION) ×2 IMPLANT
PAD ARMBOARD 7.5X6 YLW CONV (MISCELLANEOUS) ×2 IMPLANT
POUCH SPECIMEN RETRIEVAL 10MM (ENDOMECHANICALS) ×2 IMPLANT
SCISSORS LAP 5X35 DISP (ENDOMECHANICALS) ×2 IMPLANT
SET CHOLANGIOGRAPH 5 50 .035 (SET/KITS/TRAYS/PACK) ×2 IMPLANT
SET IRRIG TUBING LAPAROSCOPIC (IRRIGATION / IRRIGATOR) ×2 IMPLANT
SLEEVE ENDOPATH XCEL 5M (ENDOMECHANICALS) ×4 IMPLANT
SPECIMEN JAR SMALL (MISCELLANEOUS) ×2 IMPLANT
SPONGE GAUZE 2X2 STER 10/PKG (GAUZE/BANDAGES/DRESSINGS) ×1
SUT MNCRL AB 4-0 PS2 18 (SUTURE) ×2 IMPLANT
SUT VIC AB 3-0 SH 18 (SUTURE) ×1 IMPLANT
SUT VICRYL 0 UR6 27IN ABS (SUTURE) ×1 IMPLANT
TOWEL OR 17X24 6PK STRL BLUE (TOWEL DISPOSABLE) ×2 IMPLANT
TOWEL OR 17X26 10 PK STRL BLUE (TOWEL DISPOSABLE) ×2 IMPLANT
TRAY LAPAROSCOPIC (CUSTOM PROCEDURE TRAY) ×2 IMPLANT
TROCAR XCEL BLUNT TIP 100MML (ENDOMECHANICALS) ×2 IMPLANT
TROCAR XCEL NON-BLD 5MMX100MML (ENDOMECHANICALS) ×2 IMPLANT
TUBING INSUFFLATION (TUBING) ×2 IMPLANT

## 2014-05-14 NOTE — Op Note (Signed)
DRAKE LANDING [id number] Sep 23, 1950 05/14/2014  Laparoscopic Cholecystectomy with IOC Procedure Note  Indications: This patient presents with symptomatic gallbladder disease and will undergo laparoscopic cholecystectomy.  Pre-operative Diagnosis: Calculus of gallbladder with other cholecystitis, without mention of obstruction  Post-operative Diagnosis: Same  Surgeon: Gayland Curry   Assistants: Lisette Abu, Scrub Tech First Assist Student  Anesthesia: General endotracheal anesthesia  ASA Class: 2  Procedure Details  The patient was seen again in the Holding Room. The risks, benefits, complications, treatment options, and expected outcomes were discussed with the patient. The possibilities of reaction to medication, pulmonary aspiration, perforation of viscus, bleeding, recurrent infection, finding a normal gallbladder, the need for additional procedures, failure to diagnose a condition, the possible need to convert to an open procedure, and creating a complication requiring transfusion or operation were discussed with the patient. The likelihood of improving the patient's symptoms with return to their baseline status is good.  The patient and/or family concurred with the proposed plan, giving informed consent. The site of surgery properly noted. The patient was taken to Operating Room, identified as [name] and the procedure verified as Laparoscopic Cholecystectomy with Intraoperative Cholangiogram. A Time Out was held and the above information confirmed. Antibiotic prophylaxis was administered.   Prior to the induction of general anesthesia, antibiotic prophylaxis was administered. General endotracheal anesthesia was then administered and tolerated well. After the induction, the abdomen was prepped with Chloraprep and draped in the sterile fashion. The patient was positioned in the supine position.  Local anesthetic agent was injected into the skin near the umbilicus and an  incision made. We dissected down to the abdominal fascia with blunt dissection.  The fascia was incised vertically and we entered the peritoneal cavity bluntly.  A pursestring suture of 0-Vicryl was placed around the fascial opening.  The Hasson cannula was inserted and secured with the stay suture.  Pneumoperitoneum was then created with CO2 and tolerated well without any adverse changes in the patient's vital signs. An 5-mm port was placed in the subxiphoid position.  Two 5-mm ports were placed in the right upper quadrant. All skin incisions were infiltrated with a local anesthetic agent before making the incision and placing the trocars.   We positioned the patient in reverse Trendelenburg, tilted slightly to the patient's left.  The gallbladder was identified, the fundus grasped and retracted cephalad. Adhesions were lysed bluntly and with the electrocautery where indicated, taking care not to injure any adjacent organs or viscus. The infundibulum was grasped and retracted laterally, exposing the peritoneum overlying the triangle of Calot. This was then divided and exposed in a blunt fashion. A critical view of the cystic duct and cystic artery was obtained.  The cystic duct was clearly identified and bluntly dissected circumferentially. The cystic duct was ligated with a clip distally.   An incision was made in the cystic duct and the [facility] cholangiogram catheter introduced. The catheter was secured using a clip. A cholangiogram was then obtained which showed good visualization of the distal and proximal biliary tree with no sign of filling defects or obstruction.  Contrast flowed easily into the duodenum. The catheter was then removed.   The cystic duct was then ligated with clips and divided. The cystic artery was identified, dissected free, ligated with clips and divided as well.   The gallbladder was dissected from the liver bed in retrograde fashion with the electrocautery. The gallbladder was removed  and placed in an Endocatch sac.  The gallbladder and  Endocatch sac were then removed through the umbilical port site. The liver bed was irrigated and inspected. Hemostasis was achieved with the electrocautery. Copious irrigation was utilized and was repeatedly aspirated until clear.  The pursestring suture was used to close the umbilical fascia.    We again inspected the right upper quadrant for hemostasis.  The umbilical closure was inspected and there was no air leak and nothing trapped within the closure. Pneumoperitoneum was released as we removed the trocars.  4-0 Monocryl was used to close the skin.   Benzoin, steri-strips, and clean dressings were applied. The patient was then extubated and brought to the recovery room in stable condition. Instrument, sponge, and needle counts were correct at closure and at the conclusion of the case.   Findings: Chronic Cholecystitis with Cholelithiasis  Estimated Blood Loss: Minimal         Drains: none         Specimens: Gallbladder           Complications: None; patient tolerated the procedure well.         Disposition: PACU - hemodynamically stable.         Condition: stable  Leighton Ruff. Redmond Pulling, MD, FACS General, Bariatric, & Minimally Invasive Surgery [facility], [place]

## 2014-05-14 NOTE — Anesthesia Preprocedure Evaluation (Addendum)
Anesthesia Evaluation  Patient identified by MRN, date of birth, ID band Patient awake    Reviewed: Allergy & Precautions, H&P , NPO status , Patient's Chart, lab work & pertinent test results, reviewed documented beta blocker date and time   Airway Mallampati: II  TM Distance: >3 FB Neck ROM: Full    Dental  (+) Teeth Intact, Dental Advisory Given   Pulmonary former smoker (quit 2009  40 pack year hx),  breath sounds clear to auscultation        Cardiovascular Rhythm:Irregular     Neuro/Psych    GI/Hepatic GERD-  Medicated,  Endo/Other    Renal/GU      Musculoskeletal   Abdominal (+)  Abdomen: soft.    Peds  Hematology   Anesthesia Other Findings   Reproductive/Obstetrics                          Anesthesia Physical Anesthesia Plan  ASA: II  Anesthesia Plan: General   Post-op Pain Management:    Induction: Intravenous  Airway Management Planned: Oral ETT  Additional Equipment:   Intra-op Plan:   Post-operative Plan: Extubation in OR  Informed Consent: I have reviewed the patients History and Physical, chart, labs and discussed the procedure including the risks, benefits and alternatives for the proposed anesthesia with the patient or authorized representative who has indicated his/her understanding and acceptance.   Dental advisory given  Plan Discussed with: CRNA, Anesthesiologist and Surgeon  Anesthesia Plan Comments: (Pulse irregular, will get EKG)      Anesthesia Quick Evaluation

## 2014-05-14 NOTE — H&P (Signed)
Erica [name] 04/25/2014 10:30 AM Location: [facility] Patient #: [id number] DOB: 1950-10-21 Divorced / Language: Erica [name] / Race: Undefined Female  History of Present Illness Erica [name] M. Erica Godette MD; 04/26/2014 5:58 PM) Patient words: evaluate gallbladde forr removal.  The patient is a 63 year old female who presents for evaluation of gall stones. She is referred by Erica [name], family nurse practitioner for evaluation of gallstones. She reports that her first attack happened several weeks ago. She describes it as pain in her right side. It was burning. It was so intense and did not go away after about an hour that she presented to the [facility] emergency room. Ultrasound was performed which demonstrated gallstones without any evidence of cholecystitis. She was found to have some elevated LFTs as well. Her AST was 154, ALT 80, alkaline phosphatase 81. Otherwise her labs were normal. It was associated with nausea as well as sweating. She describes the pain as if someone put their fist in her right abdomen. Since then she has not had any severe episodes like that. She has had some mild intermittent episodes of burning in her right abdomen but not nearly as bad. She denies any fever, vomiting, diarrhea or constipation. She denies any melena or hematochezia. She denies any acholic stools. She denies any NSAIDs.   Other Problems Erica Curry, MD; 04/26/2014 5:59 PM) Cholelithiasis Hypercholesterolemia ELEVATED LFTS (790.6  R79.89) SYMPTOMATIC CHOLELITHIASIS (574.20  K80.20)  Past Surgical History Erica [name]; 04/25/2014 10:48 AM) Spinal Surgery - Neck Tonsillectomy  Diagnostic Studies History Erica [name]; 04/25/2014 10:48 AM) Colonoscopy 5-10 years ago Mammogram within last year Pap Smear 1-5 years ago  Allergies Erica [name]; 04/25/2014 10:49 AM) Betadine *ANTISEPTICS & DISINFECTANTS* Statins  Medication History Erica [name]; 04/25/2014 10:51  AM) Fenofibric Acid (135MG  Capsule DR, Oral) Active. Vitamin D3 (400UNIT Tablet, Oral) Active. Baby Aspirin (81MG  Tablet Chewable, Oral) Active. BL Flax Seed Oil (1000MG  Capsule, Oral) Active. [employer] Omega-3 Krill Oil (300MG  Capsule, Oral) Active.  Social History Erica [name]; 04/25/2014 10:48 AM) Alcohol use Occasional alcohol use. Caffeine use Carbonated beverages, Coffee, Tea. No drug use Tobacco use Former smoker.  Family History Erica [name]; 04/25/2014 10:48 AM) Arthritis Brother, Mother. Diabetes Mellitus Mother, Sister. Heart Disease Brother, Father, Mother. Heart disease in female family member before age 49 Hypertension Mother, Sister.  Pregnancy / Birth History Erica [name]; 04/25/2014 10:48 AM) Age at menarche 40 years. Age of menopause 65-55 Gravida 4 Maternal age 68-20 Para 3  Review of Systems Erica [name]; 04/25/2014 10:48 AM) General Not Present- Appetite Loss, Chills, Fatigue, Fever, Night Sweats, Weight Gain and Weight Loss. Skin Not Present- Change in Wart/Mole, Dryness, Hives, Jaundice, New Lesions, Non-Healing Wounds, Rash and Ulcer. HEENT Present- Seasonal Allergies and Wears glasses/contact lenses. Not Present- Earache, Hearing Loss, Hoarseness, Nose Bleed, Oral Ulcers, Ringing in the Ears, Sinus Pain, Sore Throat, Visual Disturbances and Yellow Eyes. Respiratory Present- Snoring. Not Present- Bloody sputum, Chronic Cough, Difficulty Breathing and Wheezing. Breast Not Present- Breast Mass, Breast Pain, Nipple Discharge and Skin Changes. Cardiovascular Not Present- Chest Pain, Difficulty Breathing Lying Down, Leg Cramps, Palpitations, Rapid Heart Rate, Shortness of Breath and Swelling of Extremities. Gastrointestinal Not Present- Abdominal Pain, Bloating, Bloody Stool, Change in Bowel Habits, Chronic diarrhea, Constipation, Difficulty Swallowing, Excessive gas, Gets full quickly at meals, Hemorrhoids, Indigestion, Nausea, Rectal  Pain and Vomiting. Female Genitourinary Not Present- Frequency, Nocturia, Painful Urination, Pelvic Pain and Urgency. Musculoskeletal Not Present- Back Pain, Joint Pain, Joint Stiffness, Muscle Pain, Muscle Weakness and Swelling of  Extremities. Neurological Not Present- Decreased Memory, Fainting, Headaches, Numbness, Seizures, Tingling, Tremor, Trouble walking and Weakness. Psychiatric Not Present- Anxiety, Bipolar, Change in Sleep Pattern, Depression, Fearful and Frequent crying. Endocrine Not Present- Cold Intolerance, Excessive Hunger, Hair Changes, Heat Intolerance, Hot flashes and New Diabetes. Hematology Not Present- Easy Bruising, Excessive bleeding, Gland problems, HIV and Persistent Infections.   Vitals Erica Schneiders Fraser; 04/25/2014 10:52 AM) 04/25/2014 10:51 AM Weight: 153.5 lb Height: 63.5in Body Surface Area: 1.77 m Body Mass Index: 26.76 kg/m Temp.: 98.53F(Tympanic)  Pulse: 80 (Regular)  Resp.: 16 (Unlabored)  BP: 128/76 (Sitting, Left Arm, Standard)    Physical Exam Erica [name] M. Erica Archambeau MD; 04/26/2014 5:55 PM) General Mental Status-Alert. General Appearance-Consistent with stated age. Hydration-Well hydrated. Voice-Normal.  Head and Neck Head-normocephalic, atraumatic with no lesions or palpable masses. Trachea-midline. Thyroid Gland Characteristics - normal size and consistency.  Eye Eyeball - Bilateral-Extraocular movements intact. Sclera/Conjunctiva - Bilateral-No scleral icterus.  Chest and Lung Exam Chest and lung exam reveals -quiet, even and easy respiratory effort with no use of accessory muscles and on auscultation, normal breath sounds, no adventitious sounds and normal vocal resonance. Inspection Chest Wall - Normal. Back - normal.  Breast - Did not examine.  Cardiovascular Cardiovascular examination reveals -normal heart sounds, regular rate and rhythm with no murmurs and normal pedal pulses  bilaterally.  Abdomen Inspection  Inspection of the abdomen reveals: Note: SMALL FASCIAL DEFECT AT UMBILICUS. NONTENDER. Skin - Scar - no surgical scars. Palpation/Percussion Palpation and Percussion of the abdomen reveal - Soft, Non Tender, No Rebound tenderness, No Rigidity (guarding) and No hepatosplenomegaly. Auscultation Auscultation of the abdomen reveals - Bowel sounds normal.  Peripheral Vascular Upper Extremity Palpation - Pulses bilaterally normal.  Neurologic Neurologic evaluation reveals -alert and oriented x 3 with no impairment of recent or remote memory. Mental Status-Normal.  Neuropsychiatric The patient's mood and affect are described as -normal. Judgment and Insight-insight is appropriate concerning matters relevant to self.  Musculoskeletal Normal Exam - Left-Upper Extremity Strength Normal and Lower Extremity Strength Normal. Normal Exam - Right-Upper Extremity Strength Normal and Lower Extremity Strength Normal.  Lymphatic Head & Neck  General Head & Neck Lymphatics: Bilateral - Description - Normal. Axillary - Did not examine. Femoral & Inguinal - Did not examine.    Assessment & Plan Erica [name] M. Kenlee Maler MD; 04/26/2014 5:59 PM) SYMPTOMATIC CHOLELITHIASIS (574.20  K80.20) Current Plans  I believe the patient's symptoms are consistent with gallbladder disease.  We discussed gallbladder disease. The patient was given Neurosurgeon. We discussed non-operative and operative management. We discussed the signs & symptoms of acute cholecystitis  I discussed laparoscopic cholecystectomy with IOC in detail. The patient was given educational material as well as diagrams detailing the procedure. We discussed the risks and benefits of a laparoscopic cholecystectomy including, but not limited to bleeding, infection, injury to surrounding structures such as the intestine or liver, bile leak, retained gallstones, need to convert to an open procedure,  prolonged diarrhea, blood clots such as DVT, common bile duct injury, anesthesia risks, and possible need for additional procedures. We discussed the typical post-operative recovery course. I explained that the likelihood of improvement of their symptoms is good.  The patient has elected to proceed with surgery. We will repeat her LFTs prior to surgery. I explained that if her LFTs are increasing THEN that may change the sequence of events Schedule for Surgery ELEVATED LFTS (790.6  R79.89) Hypercholesterolemia  Leighton Ruff. Redmond Pulling, MD, FACS General, Bariatric, & Minimally Invasive Surgery [facility]  [place]

## 2014-05-14 NOTE — Anesthesia Postprocedure Evaluation (Signed)
  Anesthesia Post-op Note  Patient: Erica [name]  Procedure(s) Performed: Procedure(s): LAPAROSCOPIC CHOLECYSTECTOMY WITH INTRAOPERATIVE CHOLANGIOGRAM (N/A)  Patient Location: PACU  Anesthesia Type:General  Level of Consciousness: awake  Airway and Oxygen Therapy: Patient Spontanous Breathing and Patient connected to nasal cannula oxygen  Post-op Pain: mild  Post-op Assessment: Post-op Vital signs reviewed, Patient's Cardiovascular Status Stable, Respiratory Function Stable and Patent Airway  Post-op Vital Signs: Reviewed and stable  Last Vitals:  Filed Vitals:   05/14/14 1436  BP: 140/56  Pulse: 63  Temp: 36.4 C  Resp: 12    Complications: No apparent anesthesia complications

## 2014-05-14 NOTE — Transfer of Care (Signed)
Immediate Anesthesia Transfer of Care Note  Patient: Erica [name]  Procedure(s) Performed: Procedure(s): LAPAROSCOPIC CHOLECYSTECTOMY WITH INTRAOPERATIVE CHOLANGIOGRAM (N/A)  Patient Location: PACU  Anesthesia Type:General  Level of Consciousness: sedated  Airway & Oxygen Therapy: Patient Spontanous Breathing and Patient connected to face mask oxygen  Post-op Assessment: Report given to PACU RN and Post -op Vital signs reviewed and stable  Post vital signs: Reviewed and stable  Complications: No apparent anesthesia complications

## 2014-05-14 NOTE — Interval H&P Note (Signed)
History and Physical Interval Note:  05/14/2014 12:53 PM  Erica [name]  has presented today for surgery, with the diagnosis of Cholelithiasis  The various methods of treatment have been discussed with the patient and family. After consideration of risks, benefits and other options for treatment, the patient has consented to  Procedure(s): LAPAROSCOPIC CHOLECYSTECTOMY WITH INTRAOPERATIVE CHOLANGIOGRAM (N/A) as a surgical intervention .  The patient's history has been reviewed, patient examined, no change in status, stable for surgery.  I have reviewed the patient's chart and labs.  Questions were answered to the patient's satisfaction.    Leighton Ruff. Redmond Pulling, MD, [place], Bariatric, & Minimally Invasive Surgery [facility], [place]   [facility]

## 2014-05-14 NOTE — Anesthesia Procedure Notes (Signed)
Procedure Name: Intubation Date/Time: 05/14/2014 1:26 PM Performed by: Maude Leriche D Pre-anesthesia Checklist: Patient identified, Emergency Drugs available, Suction available, Patient being monitored and Timeout performed Patient Re-evaluated:Patient Re-evaluated prior to inductionOxygen Delivery Method: Circle system utilized Preoxygenation: Pre-oxygenation with 100% oxygen Intubation Type: IV induction Ventilation: Mask ventilation without difficulty Laryngoscope Size: 3 and Mac (One attempt with miller 2 by CRNA.Second attempt by MDA with success) Grade View: Grade II Tube type: Oral Tube size: 7.5 mm Number of attempts: 2 Airway Equipment and Method: Stylet Placement Confirmation: ETT inserted through vocal cords under direct vision,  positive ETCO2 and breath sounds checked- equal and bilateral Secured at: 22 cm Tube secured with: Tape Dental Injury: Teeth and Oropharynx as per pre-operative assessment

## 2014-05-14 NOTE — Discharge Instructions (Signed)
[facility] °LAPAROSCOPIC SURGERY: POST OP INSTRUCTIONS °Always review your discharge instruction sheet given to you by the facility where your surgery was performed. °IF YOU HAVE DISABILITY OR FAMILY LEAVE FORMS, YOU MUST BRING THEM TO THE OFFICE FOR PROCESSING.   °DO NOT GIVE THEM TO YOUR DOCTOR. ° °1. A prescription for pain medication may be given to you upon discharge.  Take your pain medication as prescribed, if needed.  If narcotic pain medicine is not needed, then you may take acetaminophen (Tylenol) or ibuprofen (Advil) as needed. °2. Take your usually prescribed medications unless otherwise directed. °3. If you need a refill on your pain medication, please contact your pharmacy.  They will contact our office to request authorization. Prescriptions will not be filled after 5pm or on week-ends. °4. You should follow a light diet the first few days after arrival home, such as soup and crackers, etc.  Be sure to include lots of fluids daily. °5. Most patients will experience some swelling and bruising in the area of the incisions.  Ice packs will help.  Swelling and bruising can take several days to resolve.  °6. It is common to experience some constipation if taking pain medication after surgery.  Increasing fluid intake and taking a stool softener (such as Colace) will usually help or prevent this problem from occurring.  A mild laxative (Milk of Magnesia or Miralax) should be taken according to package instructions if there are no bowel movements after 48 hours. °7. Unless discharge instructions indicate otherwise, you may remove your bandages 48 hours after surgery, and you may shower at that time.  You  have steri-strips (small skin tapes) in place directly over the incision.  These strips should be left on the skin for 7-10 days.   °8. ACTIVITIES:  You may resume regular (light) daily activities beginning the next day--such as daily self-care, walking, climbing stairs--gradually  increasing activities as tolerated.  You may have sexual intercourse when it is comfortable.  Refrain from any heavy lifting or straining until approved by your doctor. °a. You may drive when you are no longer taking prescription pain medication, you can comfortably wear a seatbelt, and you can safely maneuver your car and apply brakes. °9. You should see your doctor in the office for a follow-up appointment approximately 2-3 weeks after your surgery.  Make sure that you call for this appointment within a day or two after you arrive home to insure a convenient appointment time. °10. OTHER INSTRUCTIONS:  °WHEN TO CALL YOUR DOCTOR: °1. Fever over 101.0 °2. Inability to urinate °3. Continued bleeding from incision. °4. Increased pain, redness, or drainage from the incision. °5. Increasing abdominal pain ° °The clinic staff is available to answer your questions during regular business hours.  Please don’t hesitate to call and ask to speak to one of the nurses for clinical concerns.  If you have a medical emergency, go to the nearest emergency room or call 911.  A surgeon from [facility] is always on call at the hospital. °[address], [address], [place], [place]  [address] ? [address] [address], [place], [place]   [address] °[phone] ? [phone] ? FAX [phone] °Web site: [email or web address] ° ° °What to eat: ° °For your first meals, you should eat lightly; only small meals initially.  If you do not have nausea, you may eat larger meals.  Avoid spicy, greasy and heavy food.   ° °General Anesthesia, Adult, Care After  °  Refer to this sheet in the next few weeks. These instructions provide you with information on caring for yourself after your procedure. Your health care provider may also give you more specific instructions. Your treatment has been planned according to current medical practices, but problems sometimes occur. Call your health care provider if you have any problems or  questions after your procedure.  °WHAT TO EXPECT AFTER THE PROCEDURE  °After the procedure, it is typical to experience:  °Sleepiness.  °Nausea and vomiting. °HOME CARE INSTRUCTIONS  °For the first 24 hours after general anesthesia:  °Have a responsible person with you.  °Do not drive a car. If you are alone, do not take public transportation.  °Do not drink alcohol.  °Do not take medicine that has not been prescribed by your health care provider.  °Do not sign important papers or make important decisions.  °You may resume a normal diet and activities as directed by your health care provider.  °Change bandages (dressings) as directed.  °If you have questions or problems that seem related to general anesthesia, call the hospital and ask for the anesthetist or anesthesiologist on call. °SEEK MEDICAL CARE IF:  °You have nausea and vomiting that continue the day after anesthesia.  °You develop a rash. °SEEK IMMEDIATE MEDICAL CARE IF:  °You have difficulty breathing.  °You have chest pain.  °You have any allergic problems. °Document Released: 09/20/2000 Document Revised: 02/14/2013 Document Reviewed: 12/28/2012  °ExitCare® Patient Information ©2014 [employer].  ° ° °

## 2014-05-14 NOTE — Anesthesia Postprocedure Evaluation (Signed)
  Anesthesia Post-op Note  Patient: Erica [name]  Procedure(s) Performed: Procedure(s): LAPAROSCOPIC CHOLECYSTECTOMY WITH INTRAOPERATIVE CHOLANGIOGRAM (N/A)  Patient Location: PACU  Anesthesia Type:General  Level of Consciousness: awake  Airway and Oxygen Therapy: Patient Spontanous Breathing  Post-op Pain: mild  Post-op Assessment: Post-op Vital signs reviewed, Patient's Cardiovascular Status Stable, Respiratory Function Stable, Patent Airway, No signs of Nausea or vomiting and Pain level controlled  Post-op Vital Signs: Reviewed and stable  Last Vitals:  Filed Vitals:   05/14/14 1531  BP: 125/58  Pulse: 47  Temp:   Resp: 15    Complications: No apparent anesthesia complications

## 2014-05-15 ENCOUNTER — Encounter (HOSPITAL_COMMUNITY): Payer: Self-pay | Admitting: General Surgery

## 2014-06-27 ENCOUNTER — Other Ambulatory Visit: Payer: Self-pay | Admitting: Nurse Practitioner

## 2015-01-06 ENCOUNTER — Encounter: Payer: Self-pay | Admitting: Nurse Practitioner

## 2015-01-20 ENCOUNTER — Telehealth: Payer: Self-pay | Admitting: Nurse Practitioner

## 2015-01-20 ENCOUNTER — Encounter: Payer: Self-pay | Admitting: Nurse Practitioner

## 2015-01-20 ENCOUNTER — Ambulatory Visit (INDEPENDENT_AMBULATORY_CARE_PROVIDER_SITE_OTHER): Payer: BLUE CROSS/BLUE SHIELD | Admitting: Nurse Practitioner

## 2015-01-20 VITALS — BP 133/73 | HR 67 | Temp 97.3°F | Ht 64.0 in | Wt 155.0 lb

## 2015-01-20 DIAGNOSIS — E785 Hyperlipidemia, unspecified: Secondary | ICD-10-CM

## 2015-01-20 DIAGNOSIS — M8580 Other specified disorders of bone density and structure, unspecified site: Secondary | ICD-10-CM | POA: Diagnosis not present

## 2015-01-20 DIAGNOSIS — Z0000 Encounter for general adult medical examination without abnormal findings: Secondary | ICD-10-CM | POA: Diagnosis not present

## 2015-01-20 DIAGNOSIS — K219 Gastro-esophageal reflux disease without esophagitis: Secondary | ICD-10-CM

## 2015-01-20 DIAGNOSIS — Z1212 Encounter for screening for malignant neoplasm of rectum: Secondary | ICD-10-CM

## 2015-01-20 DIAGNOSIS — Z01419 Encounter for gynecological examination (general) (routine) without abnormal findings: Secondary | ICD-10-CM | POA: Diagnosis not present

## 2015-01-20 LAB — POCT URINALYSIS DIPSTICK
Bilirubin, UA: NEGATIVE
Blood, UA: NEGATIVE
Glucose, UA: NEGATIVE
Ketones, UA: NEGATIVE
Nitrite, UA: NEGATIVE
Protein, UA: NEGATIVE
SPEC GRAV UA: 1.015
Urobilinogen, UA: NEGATIVE
pH, UA: 5

## 2015-01-20 LAB — POCT UA - MICROSCOPIC ONLY
Bacteria, U Microscopic: NEGATIVE
CASTS, UR, LPF, POC: NEGATIVE
Crystals, Ur, HPF, POC: NEGATIVE
Mucus, UA: NEGATIVE
RBC, urine, microscopic: NEGATIVE
YEAST UA: NEGATIVE

## 2015-01-20 LAB — POCT CBC
Granulocyte percent: 63.6 %G (ref 37–80)
HEMATOCRIT: 40.5 % (ref 37.7–47.9)
Hemoglobin: 13.4 g/dL (ref 12.2–16.2)
Lymph, poc: 2.1 (ref 0.6–3.4)
MCH, POC: 29.8 pg (ref 27–31.2)
MCHC: 33.2 g/dL (ref 31.8–35.4)
MCV: 89.9 fL (ref 80–97)
MPV: 7.5 fL (ref 0–99.8)
POC GRANULOCYTE: 4.6 (ref 2–6.9)
POC LYMPH PERCENT: 28.5 %L (ref 10–50)
Platelet Count, POC: 309 10*3/uL (ref 142–424)
RBC: 4.5 M/uL (ref 4.04–5.48)
RDW, POC: 13.2 %
WBC: 7.3 10*3/uL (ref 4.6–10.2)

## 2015-01-20 MED ORDER — FENOFIBRIC ACID 135 MG PO CPDR: 1.0000 | DELAYED_RELEASE_CAPSULE | Freq: Every day | ORAL | Status: DC

## 2015-01-20 NOTE — Telephone Encounter (Signed)
MMM's nurse is aware to do this when today's labs have been rcvd

## 2015-01-20 NOTE — Patient Instructions (Signed)
Exercise to Stay Healthy Exercise helps you become and stay healthy. EXERCISE IDEAS AND TIPS Choose exercises that:  You enjoy.  Fit into your day. You do not need to exercise really hard to be healthy. You can do exercises at a slow or medium level and stay healthy. You can:  Stretch before and after working out.  Try yoga, Pilates, or tai chi.  Lift weights.  Walk fast, swim, jog, run, climb stairs, bicycle, dance, or rollerskate.  Take aerobic classes. Exercises that burn about 150 calories:  Running 1  miles in 15 minutes.  Playing volleyball for 45 to 60 minutes.  Washing and waxing a car for 45 to 60 minutes.  Playing touch football for 45 minutes.  Walking 1  miles in 35 minutes.  Pushing a stroller 1  miles in 30 minutes.  Playing basketball for 30 minutes.  Raking leaves for 30 minutes.  Bicycling 5 miles in 30 minutes.  Walking 2 miles in 30 minutes.  Dancing for 30 minutes.  Shoveling snow for 15 minutes.  Swimming laps for 20 minutes.  Walking up stairs for 15 minutes.  Bicycling 4 miles in 15 minutes.  Gardening for 30 to 45 minutes.  Jumping rope for 15 minutes.  Washing windows or floors for 45 to 60 minutes. Document Released: 07/17/2010 Document Revised: 09/06/2011 Document Reviewed: 07/17/2010 ExitCare Patient Information 2015 [employer]. This information is not intended to replace advice given to you by your health care provider. Make sure you discuss any questions you have with your health care provider.  

## 2015-01-20 NOTE — Progress Notes (Signed)
   Subjective:    Patient ID: Erica [name], female    DOB: [birth year], 64 y.o.   MRN: [id number]  Patient here tiday for CPE no pap- She is doing well today without complaints.  Hyperlipidemia This is a new problem. The problem is controlled. Recent lipid tests were reviewed and are normal. Current antihyperlipidemic treatment includes statins. The current treatment provides moderate improvement of lipids. Compliance problems include adherence to diet and adherence to exercise.  Risk factors for coronary artery disease include dyslipidemia and post-menopausal.  GERD: Patient is taking prilosec $RemoveBeforeD'20mg'deURHGLUvyXUvw$  daily which she reports is helping.     Review of Systems  Constitutional: Negative.   HENT: Negative.   Eyes: Negative.   Respiratory: Negative.   Cardiovascular: Negative.   Gastrointestinal: Negative.   Endocrine: Negative.   Genitourinary: Negative.   Musculoskeletal: Negative.   Skin: Negative.   Allergic/Immunologic: Negative.   Neurological: Negative.   Hematological: Negative.   Psychiatric/Behavioral: Negative.        Objective:   Physical Exam  Constitutional: She is oriented to person, place, and time. She appears well-developed and well-nourished.  HENT:  Head: Normocephalic.  Eyes: Pupils are equal, round, and reactive to light.  Neck: Normal range of motion.  Cardiovascular: Normal rate.   Pulmonary/Chest: Effort normal.  Abdominal: Soft.  Musculoskeletal: Normal range of motion.  Neurological: She is alert and oriented to person, place, and time.  Skin: Skin is warm.  Psychiatric: She has a normal mood and affect.   BP 133/73 mmHg  Pulse 67  Temp(Src) 97.3 F (36.3 C) (Oral)  Ht $R'5\' 4"'au$  (1.626 m)  Wt 155 lb (70.308 kg)  BMI 26.59 kg/m2        Assessment & Plan:   1. Annual physical exam - POCT urinalysis dipstick - POCT UA - Microscopic Only - POCT CBC - Vit D  25 hydroxy (rtn osteoporosis monitoring) - Thyroid Panel With TSH  2.  Gastroesophageal reflux disease without esophagitis Avoid spicy foods Do not eat 2 hours prior to bedtime   3. Osteopenia Weight bearing exercises  4. Hyperlipidemia with target LDL less than 100 Low fta diet - Choline Fenofibrate (FENOFIBRIC ACID) 135 MG CPDR; Take 1 capsule by mouth daily.  Dispense: 90 capsule; Refill: 1 - CMP14+EGFR - Lipid panel  5. Encounter for routine gynecological examination - Pap IG w/ reflex to HPV when ASC-U  6. Screening for malignant neoplasm of the rectum - Fecal occult blood, imunochemical; Future    Labs pending Health maintenance reviewed Diet and exercise encouraged Continue all meds Follow up  In 6 months   [place], FNP

## 2015-01-20 NOTE — Addendum Note (Signed)
Addended by: Earlene Plater on: 01/20/2015 10:16 AM   Modules accepted: Miquel Dunn

## 2015-01-21 LAB — CMP14+EGFR
ALT: 27 IU/L (ref 0–32)
AST: 30 IU/L (ref 0–40)
Albumin/Globulin Ratio: 2 (ref 1.1–2.5)
Albumin: 4.3 g/dL (ref 3.6–4.8)
Alkaline Phosphatase: 69 IU/L (ref 39–117)
BUN / CREAT RATIO: 21 (ref 11–26)
BUN: 15 mg/dL (ref 8–27)
Bilirubin Total: 0.3 mg/dL (ref 0.0–1.2)
CALCIUM: 9.5 mg/dL (ref 8.7–10.3)
CHLORIDE: 100 mmol/L (ref 97–108)
CO2: 23 mmol/L (ref 18–29)
Creatinine, Ser: 0.72 mg/dL (ref 0.57–1.00)
GFR calc Af Amer: 103 mL/min/{1.73_m2} (ref >59)
GFR calc non Af Amer: 89 mL/min/{1.73_m2} (ref >59)
Globulin, Total: 2.2 g/dL (ref 1.5–4.5)
Glucose: 80 mg/dL (ref 65–99)
POTASSIUM: 4.5 mmol/L (ref 3.5–5.2)
Sodium: 139 mmol/L (ref 134–144)
Total Protein: 6.5 g/dL (ref 6.0–8.5)

## 2015-01-21 LAB — THYROID PANEL WITH TSH
Free Thyroxine Index: 1.7 (ref 1.2–4.9)
T3 Uptake Ratio: 29 % (ref 24–39)
T4, Total: 6 ug/dL (ref 4.5–12.0)
TSH: 3.4 u[IU]/mL (ref 0.450–4.500)

## 2015-01-21 LAB — LIPID PANEL
CHOLESTEROL TOTAL: 204 mg/dL — AB (ref 100–199)
Chol/HDL Ratio: 3.3 ratio units (ref 0.0–4.4)
HDL: 61 mg/dL (ref >39)
LDL Calculated: 130 mg/dL — ABNORMAL HIGH (ref 0–99)
TRIGLYCERIDES: 67 mg/dL (ref 0–149)
VLDL CHOLESTEROL CAL: 13 mg/dL (ref 5–40)

## 2015-01-21 LAB — VITAMIN D 25 HYDROXY (VIT D DEFICIENCY, FRACTURES): Vit D, 25-Hydroxy: 37 ng/mL (ref 30.0–100.0)

## 2015-01-23 LAB — PAP IG W/ RFLX HPV ASCU: PAP SMEAR COMMENT: 0

## 2015-01-23 LAB — HPV DNA PROBE HIGH RISK, AMPLIFIED: HPV, high-risk: NEGATIVE

## 2015-01-24 ENCOUNTER — Encounter: Payer: Self-pay | Admitting: Nurse Practitioner

## 2015-02-17 ENCOUNTER — Other Ambulatory Visit: Payer: BLUE CROSS/BLUE SHIELD

## 2015-02-17 DIAGNOSIS — Z1212 Encounter for screening for malignant neoplasm of rectum: Secondary | ICD-10-CM

## 2015-02-17 NOTE — Progress Notes (Signed)
Lab only 

## 2015-02-19 LAB — FECAL OCCULT BLOOD, IMMUNOCHEMICAL: Fecal Occult Bld: NEGATIVE

## 2015-04-23 ENCOUNTER — Telehealth: Payer: Self-pay

## 2015-04-23 NOTE — Telephone Encounter (Signed)
error 

## 2015-10-21 ENCOUNTER — Encounter: Payer: Self-pay | Admitting: Nurse Practitioner

## 2015-10-21 ENCOUNTER — Ambulatory Visit (INDEPENDENT_AMBULATORY_CARE_PROVIDER_SITE_OTHER): Payer: BLUE CROSS/BLUE SHIELD | Admitting: Nurse Practitioner

## 2015-10-21 VITALS — BP 133/70 | HR 57 | Temp 97.3°F | Ht 64.0 in | Wt 159.0 lb

## 2015-10-21 DIAGNOSIS — Z1159 Encounter for screening for other viral diseases: Secondary | ICD-10-CM | POA: Diagnosis not present

## 2015-10-21 DIAGNOSIS — E785 Hyperlipidemia, unspecified: Secondary | ICD-10-CM

## 2015-10-21 DIAGNOSIS — Z6827 Body mass index (BMI) 27.0-27.9, adult: Secondary | ICD-10-CM | POA: Diagnosis not present

## 2015-10-21 DIAGNOSIS — R894 Abnormal immunological findings in specimens from other organs, systems and tissues: Secondary | ICD-10-CM

## 2015-10-21 DIAGNOSIS — K219 Gastro-esophageal reflux disease without esophagitis: Secondary | ICD-10-CM | POA: Diagnosis not present

## 2015-10-21 DIAGNOSIS — R768 Other specified abnormal immunological findings in serum: Secondary | ICD-10-CM

## 2015-10-21 NOTE — Progress Notes (Signed)
   Subjective:    Patient ID: Erica [name], female    DOB: [birth year], 65 y.o.   MRN: [id number]  Patient here today for follow up of chronic medical problems.  Outpatient Encounter Prescriptions as of 10/21/2015  Medication Sig  . Ascorbic Acid (VITAMIN C) 1000 MG tablet Take 1,000 mg by mouth daily.  Marland Kitchen aspirin 81 MG tablet Take 81 mg by mouth daily.  . calcium citrate-vitamin D (CITRACAL+D) 315-200 MG-UNIT per tablet Take 1 tablet by mouth daily.   . cholecalciferol (VITAMIN D) 400 UNITS TABS tablet Take 400 Units by mouth.  . Choline Fenofibrate (FENOFIBRIC ACID) 135 MG CPDR Take 1 capsule by mouth daily.  . Coenzyme Q10 (CO Q-10) 200 MG CAPS Take by mouth.  . Flaxseed, Linseed, (FLAX SEED OIL) 1000 MG CAPS Take 1 capsule by mouth daily.   Javier Docker Oil 1000 MG CAPS Take 1 capsule by mouth daily.    No facility-administered encounter medications on file as of 10/21/2015.   * Patient had blood work done at work and her hep c came back positive- need to draw a hepatitis panel. Denies feeling bad- no abdominal pain.  Hyperlipidemia This is a new problem. The problem is controlled. Recent lipid tests were reviewed and are normal. Current antihyperlipidemic treatment includes statins. The current treatment provides moderate improvement of lipids. Compliance problems include adherence to diet and adherence to exercise.  Risk factors for coronary artery disease include dyslipidemia and post-menopausal.  GERD: Patient is taking prilosec 20mg  daily which she reports is helping.     Review of Systems  Constitutional: Negative.   HENT: Negative.   Eyes: Negative.   Respiratory: Negative.   Cardiovascular: Negative.   Gastrointestinal: Negative.   Endocrine: Negative.   Genitourinary: Negative.   Musculoskeletal: Negative.   Skin: Negative.   Allergic/Immunologic: Negative.   Neurological: Negative.   Hematological: Negative.   Psychiatric/Behavioral: Negative.        Objective:   Physical Exam  Constitutional: She is oriented to person, place, and time. She appears well-developed and well-nourished.  HENT:  Head: Normocephalic.  Eyes: Pupils are equal, round, and reactive to light.  Neck: Normal range of motion.  Cardiovascular: Normal rate.   Pulmonary/Chest: Effort normal.  Abdominal: Soft.  Musculoskeletal: Normal range of motion.  Neurological: She is alert and oriented to person, place, and time.  Skin: Skin is warm.  No jaundice  Psychiatric: She has a normal mood and affect.   BP 133/70 mmHg  Pulse 57  Temp(Src) 97.3 F (36.3 C) (Oral)  Ht 5\' 4"  (1.626 m)  Wt 159 lb (72.122 kg)  BMI 27.28 kg/m2       Assessment & Plan:   1. Gastroesophageal reflux disease without esophagitis Avoid spicy foods Do not eat 2 hours prior to bedtime   2. Hyperlipidemia with target LDL less than 100 Low fat diet  3. BMI 27.0-27.9,adult Discussed diet and exercise for person with BMI >25 Will recheck weight in 3-6 months  4. Need for hepatitis C screening test Patient had done at work and it came back positive  5. Hepatitis C antibody test positive Labs pending - Hepatic function panel - HCV RT-PCR, Quant (Non-Graph)    Labs pending Health maintenance reviewed Diet and exercise encouraged Continue all meds Follow up  In 6 month   [place], FNP

## 2015-10-21 NOTE — Patient Instructions (Signed)
Health Maintenance, Female Adopting a healthy lifestyle and getting preventive care can go a long way to promote health and wellness. Talk with your health care provider about what schedule of regular examinations is right for you. This is a good chance for you to check in with your provider about disease prevention and staying healthy. In between checkups, there are plenty of things you can do on your own. Experts have done a lot of research about which lifestyle changes and preventive measures are most likely to keep you healthy. Ask your health care provider for more information. WEIGHT AND DIET  Eat a healthy diet  Be sure to include plenty of vegetables, fruits, low-fat dairy products, and lean protein.  Do not eat a lot of foods high in solid fats, added sugars, or salt.  Get regular exercise. This is one of the most important things you can do for your health.  Most adults should exercise for at least 150 minutes each week. The exercise should increase your heart rate and make you sweat (moderate-intensity exercise).  Most adults should also do strengthening exercises at least twice a week. This is in addition to the moderate-intensity exercise.  Maintain a healthy weight  Body mass index (BMI) is a measurement that can be used to identify possible weight problems. It estimates body fat based on height and weight. Your health care provider can help determine your BMI and help you achieve or maintain a healthy weight.  For females 20 years of age and older:   A BMI below 18.5 is considered underweight.  A BMI of 18.5 to 24.9 is normal.  A BMI of 25 to 29.9 is considered overweight.  A BMI of 30 and above is considered obese.  Watch levels of cholesterol and blood lipids  You should start having your blood tested for lipids and cholesterol at 65 years of age, then have this test every 5 years.  You may need to have your cholesterol levels checked more often if:  Your lipid  or cholesterol levels are high.  You are older than 65 years of age.  You are at high risk for heart disease.  CANCER SCREENING   Lung Cancer  Lung cancer screening is recommended for adults 55-80 years old who are at high risk for lung cancer because of a history of smoking.  A yearly low-dose CT scan of the lungs is recommended for people who:  Currently smoke.  Have quit within the past 15 years.  Have at least a 30-pack-year history of smoking. A pack year is smoking an average of one pack of cigarettes a day for 1 year.  Yearly screening should continue until it has been 15 years since you quit.  Yearly screening should stop if you develop a health problem that would prevent you from having lung cancer treatment.  Breast Cancer  Practice breast self-awareness. This means understanding how your breasts normally appear and feel.  It also means doing regular breast self-exams. Let your health care provider know about any changes, no matter how small.  If you are in your 20s or 30s, you should have a clinical breast exam (CBE) by a health care provider every 1-3 years as part of a regular health exam.  If you are 40 or older, have a CBE every year. Also consider having a breast X-ray (mammogram) every year.  If you have a family history of breast cancer, talk to your health care provider about genetic screening.  If you   are at high risk for breast cancer, talk to your health care provider about having an MRI and a mammogram every year.  Breast cancer gene (BRCA) assessment is recommended for women who have family members with BRCA-related cancers. BRCA-related cancers include:  Breast.  Ovarian.  Tubal.  Peritoneal cancers.  Results of the assessment will determine the need for genetic counseling and BRCA1 and BRCA2 testing. Cervical Cancer Your health care provider may recommend that you be screened regularly for cancer of the pelvic organs (ovaries, uterus, and  vagina). This screening involves a pelvic examination, including checking for microscopic changes to the surface of your cervix (Pap test). You may be encouraged to have this screening done every 3 years, beginning at age 21.  For women ages 30-65, health care providers may recommend pelvic exams and Pap testing every 3 years, or they may recommend the Pap and pelvic exam, combined with testing for human papilloma virus (HPV), every 5 years. Some types of HPV increase your risk of cervical cancer. Testing for HPV may also be done on women of any age with unclear Pap test results.  Other health care providers may not recommend any screening for nonpregnant women who are considered low risk for pelvic cancer and who do not have symptoms. Ask your health care provider if a screening pelvic exam is right for you.  If you have had past treatment for cervical cancer or a condition that could lead to cancer, you need Pap tests and screening for cancer for at least 20 years after your treatment. If Pap tests have been discontinued, your risk factors (such as having a new sexual partner) need to be reassessed to determine if screening should resume. Some women have medical problems that increase the chance of getting cervical cancer. In these cases, your health care provider may recommend more frequent screening and Pap tests. Colorectal Cancer  This type of cancer can be detected and often prevented.  Routine colorectal cancer screening usually begins at 65 years of age and continues through 65 years of age.  Your health care provider may recommend screening at an earlier age if you have risk factors for colon cancer.  Your health care provider may also recommend using home test kits to check for hidden blood in the stool.  A small camera at the end of a tube can be used to examine your colon directly (sigmoidoscopy or colonoscopy). This is done to check for the earliest forms of colorectal  cancer.  Routine screening usually begins at age 50.  Direct examination of the colon should be repeated every 5-10 years through 65 years of age. However, you may need to be screened more often if early forms of precancerous polyps or small growths are found. Skin Cancer  Check your skin from head to toe regularly.  Tell your health care provider about any new moles or changes in moles, especially if there is a change in a mole's shape or color.  Also tell your health care provider if you have a mole that is larger than the size of a pencil eraser.  Always use sunscreen. Apply sunscreen liberally and repeatedly throughout the day.  Protect yourself by wearing long sleeves, pants, a wide-brimmed hat, and sunglasses whenever you are outside. HEART DISEASE, DIABETES, AND HIGH BLOOD PRESSURE   High blood pressure causes heart disease and increases the risk of stroke. High blood pressure is more likely to develop in:  People who have blood pressure in the high end   of the normal range (130-139/85-89 mm Hg).  People who are overweight or obese.  People who are African American.  If you are 38-23 years of age, have your blood pressure checked every 3-5 years. If you are 61 years of age or older, have your blood pressure checked every year. You should have your blood pressure measured twice--once when you are at a hospital or clinic, and once when you are not at a hospital or clinic. Record the average of the two measurements. To check your blood pressure when you are not at a hospital or clinic, you can use:  An automated blood pressure machine at a pharmacy.  A home blood pressure monitor.  If you are between 45 years and 39 years old, ask your health care provider if you should take aspirin to prevent strokes.  Have regular diabetes screenings. This involves taking a blood sample to check your fasting blood sugar level.  If you are at a normal weight and have a low risk for diabetes,  have this test once every three years after 65 years of age.  If you are overweight and have a high risk for diabetes, consider being tested at a younger age or more often. PREVENTING INFECTION  Hepatitis B  If you have a higher risk for hepatitis B, you should be screened for this virus. You are considered at high risk for hepatitis B if:  You were born in a country where hepatitis B is common. Ask your health care provider which countries are considered high risk.  Your parents were born in a high-risk country, and you have not been immunized against hepatitis B (hepatitis B vaccine).  You have HIV or AIDS.  You use needles to inject street drugs.  You live with someone who has hepatitis B.  You have had sex with someone who has hepatitis B.  You get hemodialysis treatment.  You take certain medicines for conditions, including cancer, organ transplantation, and autoimmune conditions. Hepatitis C  Blood testing is recommended for:  Everyone born from [birth year] through 1965.  Anyone with known risk factors for hepatitis C. Sexually transmitted infections (STIs)  You should be screened for sexually transmitted infections (STIs) including gonorrhea and chlamydia if:  You are sexually active and are younger than 65 years of age.  You are older than 65 years of age and your health care provider tells you that you are at risk for this type of infection.  Your sexual activity has changed since you were last screened and you are at an increased risk for chlamydia or gonorrhea. Ask your health care provider if you are at risk.  If you do not have HIV, but are at risk, it may be recommended that you take a prescription medicine daily to prevent HIV infection. This is called pre-exposure prophylaxis (PrEP). You are considered at risk if:  You are sexually active and do not regularly use condoms or know the HIV status of your partner(s).  You take drugs by injection.  You are sexually  active with a partner who has HIV. Talk with your health care provider about whether you are at high risk of being infected with HIV. If you choose to begin PrEP, you should first be tested for HIV. You should then be tested every 3 months for as long as you are taking PrEP.  PREGNANCY   If you are premenopausal and you may become pregnant, ask your health care provider about preconception counseling.  If you may  become pregnant, take 400 to 800 micrograms (mcg) of folic acid every day.  If you want to prevent pregnancy, talk to your health care provider about birth control (contraception). OSTEOPOROSIS AND MENOPAUSE   Osteoporosis is a disease in which the bones lose minerals and strength with aging. This can result in serious bone fractures. Your risk for osteoporosis can be identified using a bone density scan.  If you are 61 years of age or older, or if you are at risk for osteoporosis and fractures, ask your health care provider if you should be screened.  Ask your health care provider whether you should take a calcium or vitamin D supplement to lower your risk for osteoporosis.  Menopause may have certain physical symptoms and risks.  Hormone replacement therapy may reduce some of these symptoms and risks. Talk to your health care provider about whether hormone replacement therapy is right for you.  HOME CARE INSTRUCTIONS   Schedule regular health, dental, and eye exams.  Stay current with your immunizations.   Do not use any tobacco products including cigarettes, chewing tobacco, or electronic cigarettes.  If you are pregnant, do not drink alcohol.  If you are breastfeeding, limit how much and how often you drink alcohol.  Limit alcohol intake to no more than 1 drink per day for nonpregnant women. One drink equals 12 ounces of beer, 5 ounces of wine, or 1 ounces of hard liquor.  Do not use street drugs.  Do not share needles.  Ask your health care provider for help if  you need support or information about quitting drugs.  Tell your health care provider if you often feel depressed.  Tell your health care provider if you have ever been abused or do not feel safe at home.   This information is not intended to replace advice given to you by your health care provider. Make sure you discuss any questions you have with your health care provider.   Document Released: 12/28/2010 Document Revised: 07/05/2014 Document Reviewed: 05/16/2013 Elsevier Interactive Patient Education [employer].

## 2015-10-22 ENCOUNTER — Other Ambulatory Visit: Payer: BLUE CROSS/BLUE SHIELD

## 2015-11-06 LAB — HCV RT-PCR, QUANT (NON-GRAPH)

## 2015-11-06 LAB — HEPATIC FUNCTION PANEL
ALBUMIN: 4.4 g/dL (ref 3.6–4.8)
ALT: 25 IU/L (ref 0–32)
AST: 31 IU/L (ref 0–40)
Alkaline Phosphatase: 69 IU/L (ref 39–117)
Bilirubin Total: 0.3 mg/dL (ref 0.0–1.2)
Bilirubin, Direct: 0.09 mg/dL (ref 0.00–0.40)
Total Protein: 6.8 g/dL (ref 6.0–8.5)

## 2015-11-06 LAB — SPECIMEN STATUS REPORT

## 2016-01-27 ENCOUNTER — Encounter: Payer: Self-pay | Admitting: Nurse Practitioner

## 2016-01-27 ENCOUNTER — Ambulatory Visit (INDEPENDENT_AMBULATORY_CARE_PROVIDER_SITE_OTHER): Payer: BLUE CROSS/BLUE SHIELD | Admitting: Nurse Practitioner

## 2016-01-27 VITALS — BP 137/78 | HR 71 | Temp 97.8°F | Ht 64.0 in | Wt 161.0 lb

## 2016-01-27 DIAGNOSIS — Z6827 Body mass index (BMI) 27.0-27.9, adult: Secondary | ICD-10-CM

## 2016-01-27 DIAGNOSIS — M8580 Other specified disorders of bone density and structure, unspecified site: Secondary | ICD-10-CM

## 2016-01-27 DIAGNOSIS — E785 Hyperlipidemia, unspecified: Secondary | ICD-10-CM

## 2016-01-27 DIAGNOSIS — Z0000 Encounter for general adult medical examination without abnormal findings: Secondary | ICD-10-CM

## 2016-01-27 DIAGNOSIS — K219 Gastro-esophageal reflux disease without esophagitis: Secondary | ICD-10-CM

## 2016-01-27 MED ORDER — FENOFIBRIC ACID 135 MG PO CPDR: 1.0000 | DELAYED_RELEASE_CAPSULE | Freq: Every day | ORAL | 1 refills | Status: DC

## 2016-01-27 NOTE — Progress Notes (Signed)
Subjective:    Patient ID: Erica [name], female    DOB: 07/03/1950, 65 y.o.   MRN: [id number]  Patient here today for annual physical exam ( NO PAP ) and  follow up of chronic medical problems.  Outpatient Encounter Prescriptions as of 01/27/2016  Medication Sig  . Ascorbic Acid (VITAMIN C) 1000 MG tablet Take 1,000 mg by mouth daily.  Marland Kitchen aspirin 81 MG tablet Take 81 mg by mouth daily.  . calcium citrate-vitamin D (CITRACAL+D) 315-200 MG-UNIT per tablet Take 1 tablet by mouth daily.   . cholecalciferol (VITAMIN D) 400 UNITS TABS tablet Take 400 Units by mouth.  . Choline Fenofibrate (FENOFIBRIC ACID) 135 MG CPDR Take 1 capsule by mouth daily.  . Coenzyme Q10 (CO Q-10) 200 MG CAPS Take by mouth.  . cyanocobalamin 1000 MCG tablet Take 1,000 mcg by mouth daily.  . Flaxseed, Linseed, (FLAX SEED OIL) 1000 MG CAPS Take 1 capsule by mouth daily.   Javier Docker Oil 1000 MG CAPS Take 1 capsule by mouth daily.   . Multiple Vitamins-Minerals (CENTRUM SILVER PO) Take by mouth.   No facility-administered encounter medications on file as of 01/27/2016.     Hyperlipidemia  This is a new problem. The problem is controlled. Recent lipid tests were reviewed and are normal. Current antihyperlipidemic treatment includes statins. The current treatment provides moderate improvement of lipids. Compliance problems include adherence to diet and adherence to exercise.  Risk factors for coronary artery disease include dyslipidemia and post-menopausal.  GERD: Patient is taking prilosec 62m daily which she reports is helping.  osteopenia Tries to walk daily  Review of Systems  Constitutional: Negative.   HENT: Negative.   Eyes: Negative.   Respiratory: Negative.   Cardiovascular: Negative.   Gastrointestinal: Negative.   Endocrine: Negative.   Genitourinary: Negative.   Musculoskeletal: Negative.   Skin: Negative.   Allergic/Immunologic: Negative.   Neurological: Negative.   Hematological: Negative.     Psychiatric/Behavioral: Negative.        Objective:   Physical Exam  Constitutional: She is oriented to person, place, and time. She appears well-developed and well-nourished.  HENT:  Head: Normocephalic.  Eyes: Pupils are equal, round, and reactive to light.  Neck: Normal range of motion.  Cardiovascular: Normal rate.   Pulmonary/Chest: Effort normal.  Abdominal: Soft.  Musculoskeletal: Normal range of motion.  Neurological: She is alert and oriented to person, place, and time.  Skin: Skin is warm.  No jaundice  Psychiatric: She has a normal mood and affect.   BP 137/78   Pulse 71   Temp 97.8 F (36.6 C) (Oral)   Ht _0  (1.626 m)   Wt 161 lb (73 kg)   BMI 27.64 kg/m        Assessment & Plan:   1. Annual physical exam - Thyroid Panel With TSH - VITAMIN D 25 Hydroxy (Vit-D Deficiency, Fractures) - CBC with Differential/Platelet  2. Gastroesophageal reflux disease without esophagitis Avoid spicy foods Do not eat 2 hours prior to bedtime  3. Hyperlipidemia with target LDL less than 100 Low fat diet - Choline Fenofibrate (FENOFIBRIC ACID) 135 MG CPDR; Take 1 capsule by mouth daily.  Dispense: 90 capsule; Refill: 1 - CMP14+EGFR - Lipid panel  4. BMI 27.0-27.9,adult Discussed diet and exercise for person with BMI >25 Will recheck weight in 3-6 months  5. Osteopenia Weight bearing exercises encouraged    Labs pending Health maintenance reviewed Diet and exercise encouraged Continue all meds Follow up  In 6 months   [place], FNP

## 2016-01-28 LAB — LIPID PANEL
CHOLESTEROL TOTAL: 226 mg/dL — AB (ref 100–199)
Chol/HDL Ratio: 3.9 ratio units (ref 0.0–4.4)
HDL: 58 mg/dL (ref >39)
LDL Calculated: 142 mg/dL — ABNORMAL HIGH (ref 0–99)
TRIGLYCERIDES: 130 mg/dL (ref 0–149)
VLDL CHOLESTEROL CAL: 26 mg/dL (ref 5–40)

## 2016-01-28 LAB — VITAMIN D 25 HYDROXY (VIT D DEFICIENCY, FRACTURES): VIT D 25 HYDROXY: 40.3 ng/mL (ref 30.0–100.0)

## 2016-01-28 LAB — CMP14+EGFR
ALK PHOS: 77 IU/L (ref 39–117)
ALT: 23 IU/L (ref 0–32)
AST: 30 IU/L (ref 0–40)
Albumin/Globulin Ratio: 1.7 (ref 1.2–2.2)
Albumin: 4.2 g/dL (ref 3.6–4.8)
BUN/Creatinine Ratio: 26 (ref 12–28)
BUN: 18 mg/dL (ref 8–27)
Bilirubin Total: 0.3 mg/dL (ref 0.0–1.2)
CHLORIDE: 100 mmol/L (ref 96–106)
CO2: 24 mmol/L (ref 18–29)
CREATININE: 0.69 mg/dL (ref 0.57–1.00)
Calcium: 9.4 mg/dL (ref 8.7–10.3)
GFR calc Af Amer: 106 mL/min/{1.73_m2} (ref >59)
GFR calc non Af Amer: 92 mL/min/{1.73_m2} (ref >59)
Globulin, Total: 2.5 g/dL (ref 1.5–4.5)
Glucose: 83 mg/dL (ref 65–99)
Potassium: 4.4 mmol/L (ref 3.5–5.2)
Sodium: 140 mmol/L (ref 134–144)
Total Protein: 6.7 g/dL (ref 6.0–8.5)

## 2016-01-28 LAB — CBC WITH DIFFERENTIAL/PLATELET
BASOS ABS: 0 10*3/uL (ref 0.0–0.2)
Basos: 0 %
EOS (ABSOLUTE): 0.3 10*3/uL (ref 0.0–0.4)
Eos: 3 %
HEMOGLOBIN: 13.7 g/dL (ref 11.1–15.9)
Hematocrit: 40.8 % (ref 34.0–46.6)
Immature Grans (Abs): 0 10*3/uL (ref 0.0–0.1)
Immature Granulocytes: 0 %
LYMPHS ABS: 2.1 10*3/uL (ref 0.7–3.1)
Lymphs: 26 %
MCH: 31.1 pg (ref 26.6–33.0)
MCHC: 33.6 g/dL (ref 31.5–35.7)
MCV: 93 fL (ref 79–97)
MONOCYTES: 8 %
Monocytes Absolute: 0.7 10*3/uL (ref 0.1–0.9)
Neutrophils Absolute: 5.1 10*3/uL (ref 1.4–7.0)
Neutrophils: 63 %
PLATELETS: 322 10*3/uL (ref 150–379)
RBC: 4.41 x10E6/uL (ref 3.77–5.28)
RDW: 13.8 % (ref 12.3–15.4)
WBC: 8.2 10*3/uL (ref 3.4–10.8)

## 2016-01-28 LAB — THYROID PANEL WITH TSH
FREE THYROXINE INDEX: 2 (ref 1.2–4.9)
T3 Uptake Ratio: 30 % (ref 24–39)
T4, Total: 6.5 ug/dL (ref 4.5–12.0)
TSH: 4.26 u[IU]/mL (ref 0.450–4.500)

## 2016-08-17 IMAGING — RF DG CHOLANGIOGRAM OPERATIVE
1 series · 9 of 9 positions shown · non-contrast
Comparison: 04/09/2014

CLINICAL DATA: Cholelithiasis, laparoscopic cholecystectomy

EXAM:
INTRAOPERATIVE CHOLANGIOGRAM
TECHNIQUE: Cholangiographic images from the C-arm fluoroscopic device were
submitted for interpretation post-operatively. Please see the
procedural report for the amount of contrast and the fluoroscopy
time utilized.

[Series 1: run · 4 acquisitions, 9 frames shown]
[im 1/4]
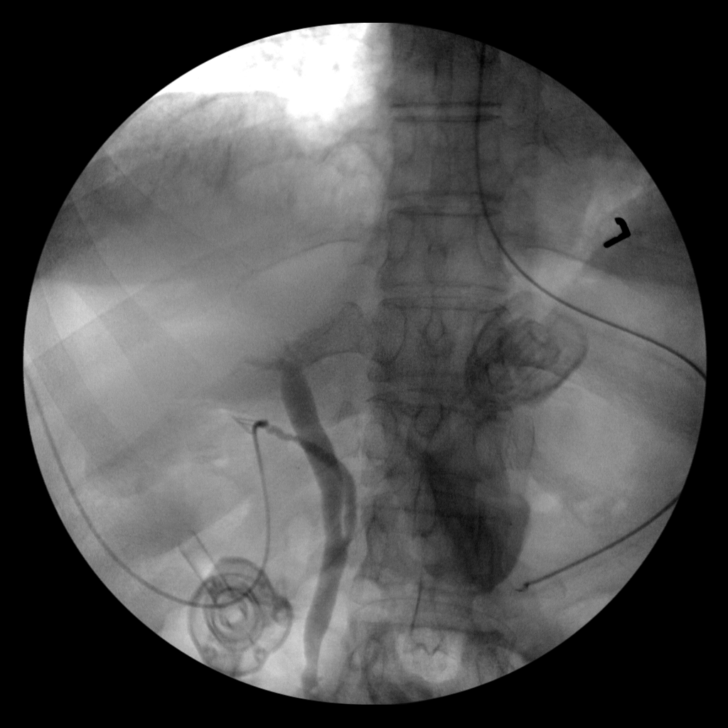
[im 1/4]
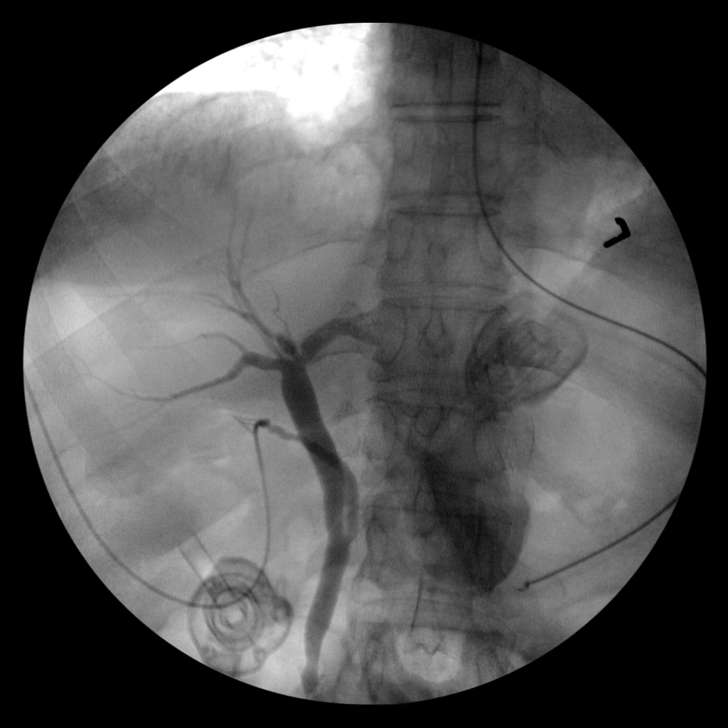
[im 1/4]
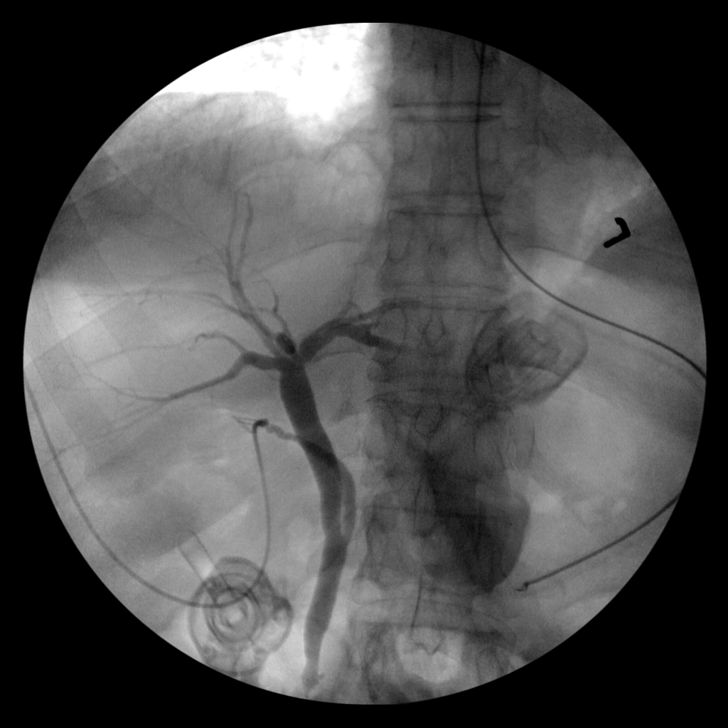
[im 2/4]
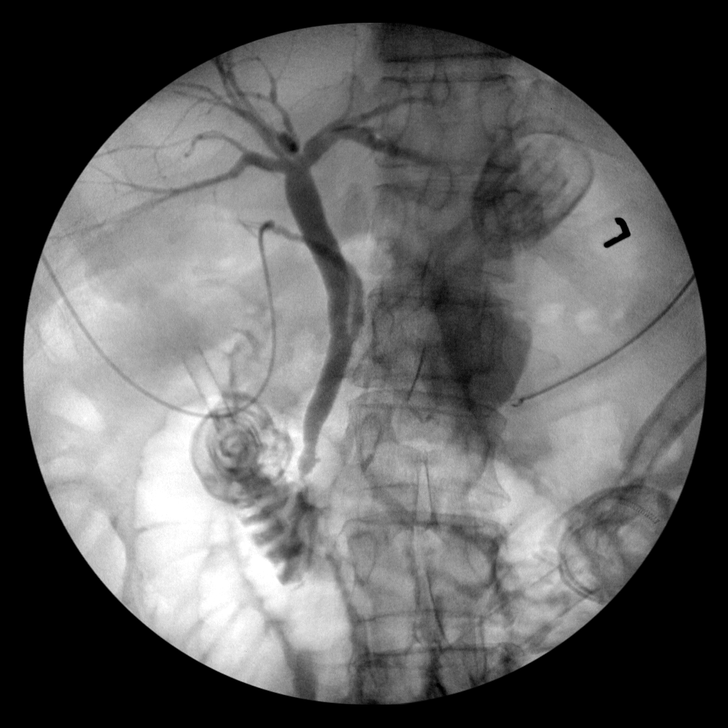
[im 2/4]
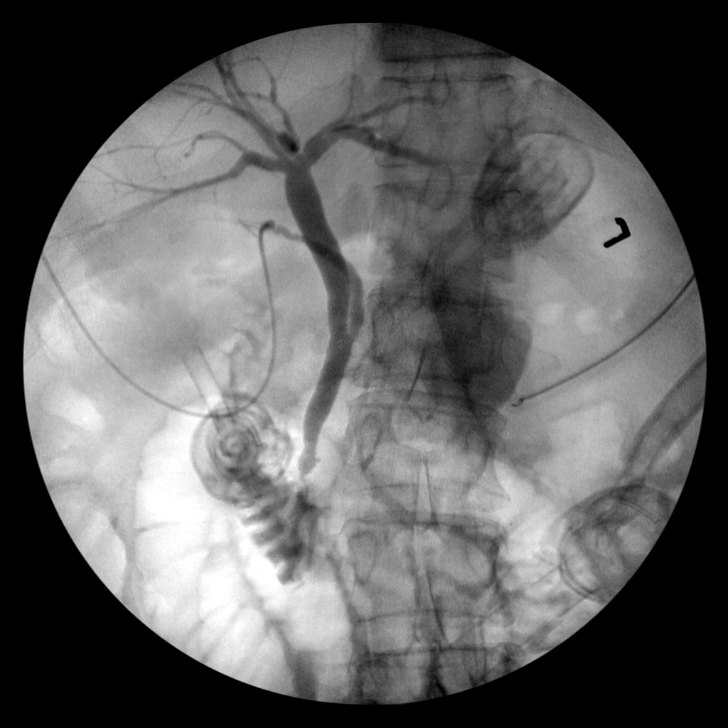
[im 2/4]
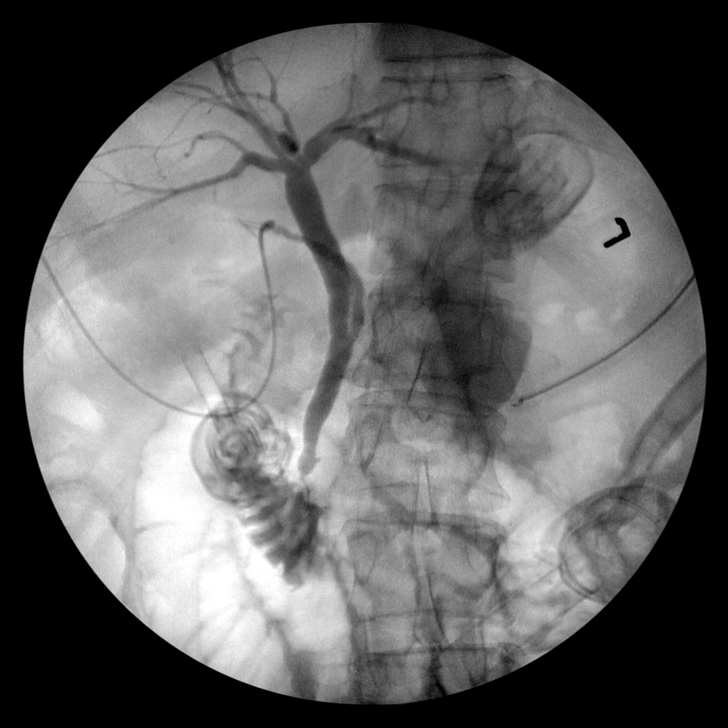
[im 2/4]
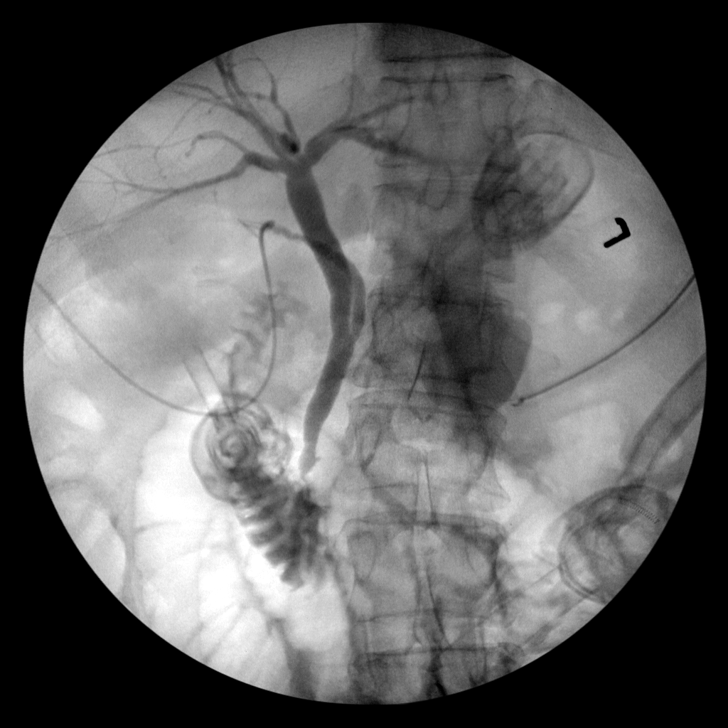
[im 3/4]
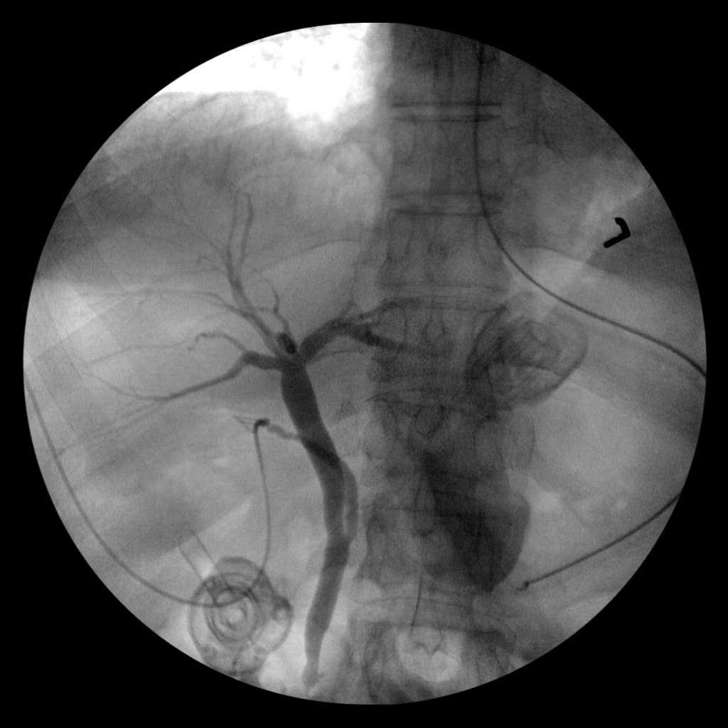
[im 4/4]
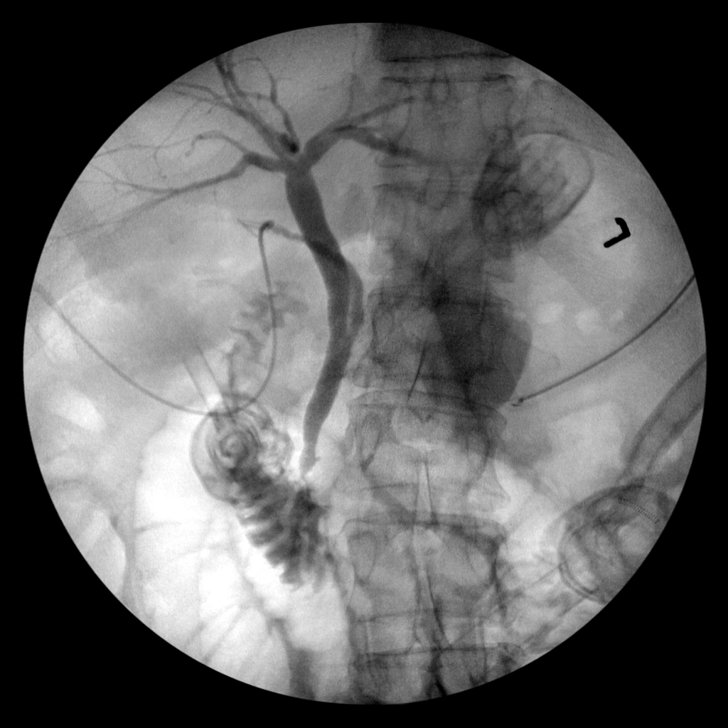

[9 of 9 positions shown; findings below may reference images not displayed]

FINDINGS: Intraoperative cholangiogram performed during the laparoscopic
cholecystectomy. The cystic duct, intrahepatic ducts, biliary
confluence, common hepatic duct, and common bile duct are all
patent. Cystic duct is noted to have a low insertion on the common
bile duct, a normal variant. No dilatation or obstruction. No
filling defect. Contrast drains into the duodenum.
IMPRESSION: Patent biliary system.

## 2017-02-01 ENCOUNTER — Ambulatory Visit (INDEPENDENT_AMBULATORY_CARE_PROVIDER_SITE_OTHER): Payer: BLUE CROSS/BLUE SHIELD | Admitting: Nurse Practitioner

## 2017-02-01 ENCOUNTER — Encounter: Payer: Self-pay | Admitting: Nurse Practitioner

## 2017-02-01 VITALS — BP 133/72 | HR 64 | Temp 98.4°F | Ht 64.0 in | Wt 161.0 lb

## 2017-02-01 DIAGNOSIS — Z0000 Encounter for general adult medical examination without abnormal findings: Secondary | ICD-10-CM

## 2017-02-01 DIAGNOSIS — Z6827 Body mass index (BMI) 27.0-27.9, adult: Secondary | ICD-10-CM | POA: Diagnosis not present

## 2017-02-01 DIAGNOSIS — M8580 Other specified disorders of bone density and structure, unspecified site: Secondary | ICD-10-CM

## 2017-02-01 DIAGNOSIS — E785 Hyperlipidemia, unspecified: Secondary | ICD-10-CM

## 2017-02-01 DIAGNOSIS — Z1211 Encounter for screening for malignant neoplasm of colon: Secondary | ICD-10-CM

## 2017-02-01 DIAGNOSIS — K219 Gastro-esophageal reflux disease without esophagitis: Secondary | ICD-10-CM

## 2017-02-01 MED ORDER — FENOFIBRIC ACID 135 MG PO CPDR: 1.0000 | DELAYED_RELEASE_CAPSULE | Freq: Every day | ORAL | 3 refills | Status: DC

## 2017-02-01 NOTE — Progress Notes (Signed)
Subjective:    Patient ID: Erica [name], female    DOB: [birth year], 66 y.o.   MRN: [id number]  HPI Erica [name] is here today for a comprehensive physical exam and follow up of chronic medical problems.  Outpatient Encounter Prescriptions as of 02/01/2017  Medication Sig  . Ascorbic Acid (VITAMIN C) 1000 MG tablet Take 1,000 mg by mouth daily.  Marland Kitchen aspirin 81 MG tablet Take 81 mg by mouth daily.  . calcium citrate-vitamin D (CITRACAL+D) 315-200 MG-UNIT per tablet Take 1 tablet by mouth daily.   . cholecalciferol (VITAMIN D) 400 UNITS TABS tablet Take 400 Units by mouth.  . Choline Fenofibrate (FENOFIBRIC ACID) 135 MG CPDR Take 1 capsule by mouth daily.  . Coenzyme Q10 (CO Q-10) 200 MG CAPS Take by mouth.  . cyanocobalamin 1000 MCG tablet Take 1,000 mcg by mouth daily.  . Flaxseed, Linseed, (FLAX SEED OIL) 1000 MG CAPS Take 1 capsule by mouth daily.   Javier Docker Oil 1000 MG CAPS Take 1 capsule by mouth daily.   . Multiple Vitamins-Minerals (CENTRUM SILVER PO) Take by mouth.   No facility-administered encounter medications on file as of 02/01/2017.     1. Gastroesophageal reflux disease without esophagitis  Patient manages with limiting dietary intake of spicy/fatty foods and 20 mg prilosec daily.  2. Osteopenia, unspecified location  Patient takes calcium supplement and participates in weight-bearing exercises.  3. Hyperlipidemia with target LDL less than 100  Patient taking fenofibrate and labs being monitored regularly.  4. BMI 27.0-27.9,adult  No significant weight gain or loss.    New complaints: None today.  Social history: Going to retire in July of 2019   Review of Systems  Constitutional: Negative for activity change, appetite change and fatigue.  Respiratory: Negative for cough and shortness of breath.   Cardiovascular: Negative for chest pain and palpitations.  Gastrointestinal: Negative for abdominal pain.  Musculoskeletal: Negative for arthralgias.    Neurological: Negative for dizziness, light-headedness and headaches.  All other systems reviewed and are negative.      Objective:   Physical Exam  Constitutional: She is oriented to person, place, and time. She appears well-developed and well-nourished. No distress.  HENT:  Head: Normocephalic.  Right Ear: External ear normal.  Left Ear: External ear normal.  Mouth/Throat: Oropharynx is clear and moist.  Eyes: Pupils are equal, round, and reactive to light. Conjunctivae and EOM are normal.  Neck: Normal range of motion. Neck supple. No thyromegaly present.  Cardiovascular: Normal rate, regular rhythm, normal heart sounds and intact distal pulses.  Exam reveals no gallop and no friction rub.   No murmur heard. Pulmonary/Chest: Effort normal and breath sounds normal. No respiratory distress. She has no wheezes.  Abdominal: Soft. Bowel sounds are normal. She exhibits no distension. There is no tenderness. There is no guarding.  Genitourinary: Vagina normal.  Musculoskeletal: Normal range of motion. She exhibits no edema.  Lymphadenopathy:    She has no cervical adenopathy.  Neurological: She is alert and oriented to person, place, and time. She has normal reflexes.  Skin: Skin is warm and dry.  Psychiatric: She has a normal mood and affect. Her behavior is normal. Judgment and thought content normal.   BP 133/72   Pulse 64   Temp 98.4 F (36.9 C) (Oral)   Ht 5' 4"  (1.626 m)   Wt 161 lb (73 kg)   BMI 27.64 kg/m       Assessment & Plan:  1. Annual physical exam  2. Gastroesophageal reflux disease without esophagitis Avoid spicy foods Do not eat 2 hours prior to bedtime  3. Osteopenia, unspecified location Weight bearing exercise  4. Hyperlipidemia with target LDL less than 100 Low fat diet - Choline Fenofibrate (FENOFIBRIC ACID) 135 MG CPDR; Take 1 capsule by mouth daily.  Dispense: 90 capsule; Refill: 3 - CMP14+EGFR - Lipid panel  5. BMI  27.0-27.9,adult Discussed diet and exercise for person with BMI >25 Will recheck weight in 3-6 months  6. Colon cancer screening - Ambulatory referral to Gastroenterology    Labs pending Health maintenance reviewed Diet and exercise encouraged Continue all meds Follow up  In 1 year and prn   Mary-Margaret Hassell Done, FNP

## 2017-02-01 NOTE — Patient Instructions (Signed)

## 2017-02-02 LAB — CMP14+EGFR
ALK PHOS: 90 IU/L (ref 39–117)
ALT: 31 IU/L (ref 0–32)
AST: 37 IU/L (ref 0–40)
Albumin/Globulin Ratio: 2.1 (ref 1.2–2.2)
Albumin: 4.5 g/dL (ref 3.6–4.8)
BUN / CREAT RATIO: 24 (ref 12–28)
BUN: 18 mg/dL (ref 8–27)
CHLORIDE: 103 mmol/L (ref 96–106)
CO2: 25 mmol/L (ref 20–29)
Calcium: 9.4 mg/dL (ref 8.7–10.3)
Creatinine, Ser: 0.76 mg/dL (ref 0.57–1.00)
GFR calc Af Amer: 95 mL/min/{1.73_m2} (ref >59)
GFR calc non Af Amer: 83 mL/min/{1.73_m2} (ref >59)
Globulin, Total: 2.1 g/dL (ref 1.5–4.5)
Glucose: 93 mg/dL (ref 65–99)
Potassium: 4 mmol/L (ref 3.5–5.2)
Sodium: 141 mmol/L (ref 134–144)
Total Protein: 6.6 g/dL (ref 6.0–8.5)

## 2017-02-02 LAB — LIPID PANEL
CHOLESTEROL TOTAL: 227 mg/dL — AB (ref 100–199)
Chol/HDL Ratio: 4.1 ratio (ref 0.0–4.4)
HDL: 56 mg/dL (ref >39)
LDL Calculated: 147 mg/dL — ABNORMAL HIGH (ref 0–99)
Triglycerides: 121 mg/dL (ref 0–149)
VLDL CHOLESTEROL CAL: 24 mg/dL (ref 5–40)

## 2017-02-03 ENCOUNTER — Encounter: Payer: Self-pay | Admitting: Gastroenterology

## 2017-02-07 ENCOUNTER — Telehealth: Payer: Self-pay | Admitting: Nurse Practitioner

## 2017-02-07 NOTE — Telephone Encounter (Signed)
Please review last lab and advise

## 2017-02-08 NOTE — Telephone Encounter (Signed)
Patient said she will agree to take statin daily, will call if any problems.  Please send to [facility]

## 2017-02-08 NOTE — Telephone Encounter (Signed)
Will need to take daily- will still agree

## 2017-02-09 MED ORDER — ATORVASTATIN CALCIUM 40 MG PO TABS: 40.0000 mg | ORAL_TABLET | Freq: Every day | ORAL | 1 refills | Status: DC

## 2017-02-09 NOTE — Telephone Encounter (Signed)
Aware of script at mail order.

## 2017-02-09 NOTE — Telephone Encounter (Signed)
lipitor 40 sent to mail order

## 2017-04-08 ENCOUNTER — Encounter: Payer: Self-pay | Admitting: Gastroenterology

## 2017-06-09 ENCOUNTER — Other Ambulatory Visit: Payer: Self-pay | Admitting: Nurse Practitioner

## 2017-06-09 ENCOUNTER — Encounter: Payer: Self-pay | Admitting: Family Medicine

## 2017-06-09 ENCOUNTER — Ambulatory Visit: Payer: BLUE CROSS/BLUE SHIELD | Admitting: Family Medicine

## 2017-06-09 VITALS — BP 131/70 | HR 65 | Temp 98.0°F | Ht 64.0 in | Wt 167.0 lb

## 2017-06-09 DIAGNOSIS — H1013 Acute atopic conjunctivitis, bilateral: Secondary | ICD-10-CM

## 2017-06-09 DIAGNOSIS — J309 Allergic rhinitis, unspecified: Secondary | ICD-10-CM

## 2017-06-09 MED ORDER — METHYLPREDNISOLONE ACETATE 80 MG/ML IJ SUSP
80.0000 mg | Freq: Once | INTRAMUSCULAR | Status: AC
  Administered 2017-06-09: 80 mg via INTRAMUSCULAR

## 2017-06-09 NOTE — Progress Notes (Signed)
BP 131/70   Pulse 65   Temp 98 F (36.7 C) (Oral)   Ht 5\' 4"  (1.626 m)   Wt 167 lb (75.8 kg)   BMI 28.67 kg/m    Subjective:    Patient ID: Erica [name], female    DOB: [birth year], 66 y.o.   MRN: [id number]  HPI: Erica [name] is a 66 y.o. female presenting on 06/09/2017 for Eyes watery and red, nasal congestion, sneezing (x 5 days, using Visine Allergy, Zyrtec, Benadryl, used Azelastine eye drops this morning)   HPI Nasal congestion and sneezing and eye irritation Patient comes in today with complaints of nasal congestion and sneezing and eye irritation that going on for the past 4 or 5 days.  She has been using Zyrtec and Benadryl and Azelastine eyedrops which have helped some but it continues to worsen.  She says her eyes are watering all of the time and she is starting to get a little irritation and swelling below her eye.  She denies any fevers or chills or redness or warmth.  She denies any shortness of breath or wheezing.  She has gotten allergies before but not this bad  Relevant past medical, surgical, family and social history reviewed and updated as indicated. Interim medical history since our last visit reviewed. Allergies and medications reviewed and updated.  Review of Systems  Constitutional: Negative for chills and fever.  HENT: Positive for congestion, postnasal drip, rhinorrhea, sneezing and sore throat. Negative for ear discharge, ear pain and sinus pressure.   Eyes: Positive for discharge, redness and itching. Negative for pain and visual disturbance.  Respiratory: Positive for cough. Negative for chest tightness, shortness of breath and wheezing.   Cardiovascular: Negative for chest pain and leg swelling.  Genitourinary: Negative for difficulty urinating and dysuria.  Musculoskeletal: Negative for back pain and gait problem.  Skin: Negative for rash.  Neurological: Negative for light-headedness and headaches.  Psychiatric/Behavioral: Negative for agitation  and behavioral problems.  All other systems reviewed and are negative.   Per HPI unless specifically indicated above        Objective:    BP 131/70   Pulse 65   Temp 98 F (36.7 C) (Oral)   Ht 5\' 4"  (1.626 m)   Wt 167 lb (75.8 kg)   BMI 28.67 kg/m   Wt Readings from Last 3 Encounters:  06/09/17 167 lb (75.8 kg)  02/01/17 161 lb (73 kg)  01/27/16 161 lb (73 kg)    Physical Exam  Constitutional: She is oriented to person, place, and time. She appears well-developed and well-nourished. No distress.  HENT:  Right Ear: Tympanic membrane, external ear and ear canal normal.  Left Ear: Tympanic membrane, external ear and ear canal normal.  Nose: Mucosal edema and rhinorrhea present. No epistaxis. Right sinus exhibits no maxillary sinus tenderness and no frontal sinus tenderness. Left sinus exhibits no maxillary sinus tenderness and no frontal sinus tenderness.  Mouth/Throat: Uvula is midline and mucous membranes are normal. Posterior oropharyngeal edema present. No oropharyngeal exudate, posterior oropharyngeal erythema or tonsillar abscesses.  Eyes: Conjunctivae and EOM are normal. Right eye exhibits discharge (Clear watery discharge). Left eye exhibits discharge. Right conjunctiva is not injected. Right conjunctiva has no hemorrhage. Left conjunctiva is not injected. Left conjunctiva has no hemorrhage.  Neck: Neck supple. No thyromegaly present.  Cardiovascular: Normal rate, regular rhythm, normal heart sounds and intact distal pulses.  No murmur heard. Pulmonary/Chest: Effort normal and breath sounds normal. No respiratory distress. She  has no wheezes. She has no rales.  Musculoskeletal: Normal range of motion. She exhibits no edema or tenderness.  Lymphadenopathy:    She has no cervical adenopathy.  Neurological: She is alert and oriented to person, place, and time. Coordination normal.  Skin: Skin is warm and dry. No rash noted. She is not diaphoretic.  Psychiatric: She has a  normal mood and affect. Her behavior is normal.  Vitals reviewed.       Assessment & Plan:   Problem List Items Addressed This Visit    None    Visit Diagnoses    Allergic conjunctivitis of both eyes and rhinitis    -  Primary   Relevant Medications   methylPREDNISolone acetate (DEPO-MEDROL) injection 80 mg       Follow up plan: Return if symptoms worsen or fail to improve.  Counseling provided for all of the vaccine components No orders of the defined types were placed in this encounter.   Caryl Pina, MD [facility] 06/09/2017, 12:27 PM

## 2017-12-19 ENCOUNTER — Ambulatory Visit: Payer: BLUE CROSS/BLUE SHIELD | Admitting: Pediatrics

## 2017-12-19 ENCOUNTER — Encounter: Payer: Self-pay | Admitting: Pediatrics

## 2017-12-19 VITALS — BP 133/77 | HR 62 | Temp 97.2°F | Ht 64.0 in | Wt 165.8 lb

## 2017-12-19 DIAGNOSIS — M5432 Sciatica, left side: Secondary | ICD-10-CM

## 2017-12-19 MED ORDER — PREDNISONE 10 MG (21) PO TBPK: ORAL_TABLET | Freq: Every day | ORAL | 0 refills | Status: DC

## 2017-12-19 NOTE — Progress Notes (Signed)
  Subjective:   Patient ID: Erica [name], female    DOB: 12/20/1950, 67 y.o.   MRN: [id number] CC: Back Pain  HPI: Erica [name] is a 67 y.o. female   Has had pain off and on in her lower back for the last 3 months.  About 50% of the time she is affected by the pain.  Some days are fine, she is able to do regular activities.  It is usually her left lower back, often radiates down into her left upper leg.  No fever or chills.  No history of cancer.  She has been taking Aleve with some improvement in her symptoms, for the last few days it has not been helping as much.  She stays fairly active, playing golf last week, had some pain in the next couple days.  When picking blueberries outside yesterday she was unsure if she would be able to make it back to the house as a pain that started in her back.  She is been wearing orthotics and tennis shoes that she has had from the past more often for the last week, she does think it is helped a lot with the pain.  Today she is still very uncomfortable from pain that started yesterday.  No numbness or tingling in the leg.  Relevant past medical, surgical, family and social history reviewed. Allergies and medications reviewed and updated. Social History   Tobacco Use  Smoking Status Former Smoker  . Packs/day: 1.00  . Years: 40.00  . Pack years: 40.00  . Types: Cigarettes  . Last attempt to quit: 06/29/2007  . Years since quitting: 10.4  Smokeless Tobacco Never Used   ROS: Per HPI   Objective:    BP 133/77   Pulse 62   Temp (!) 97.2 F (36.2 C) (Oral)   Ht 5\' 4"  (1.626 m)   Wt 165 lb 12.8 oz (75.2 kg)   BMI 28.46 kg/m   Wt Readings from Last 3 Encounters:  12/19/17 165 lb 12.8 oz (75.2 kg)  06/09/17 167 lb (75.8 kg)  02/01/17 161 lb (73 kg)    Gen: NAD, alert, cooperative with exam, NCAT EYES: EOMI, no conjunctival injection, or no icterus CV: NRRR, normal S1/S2, no murmur, distal pulses 2+ b/l Resp: CTABL, no wheezes, normal  WOB Ext: No edema, warm Neuro: Alert and oriented, strength equal b/l UE and LE, coordination grossly normal, patellar reflex 2+ bilaterally.  Sensation intact bilaterally. MSK: No point tenderness over spine or paraspinal muscles.  Negative straight leg raise bilaterally.  Assessment & Plan:  Erica [name] was seen today for back pain.  Diagnoses and all orders for this visit:  Sciatica of left side Rest, gentle back exercises, heat and ice as needed.  Will do trial of steroid Dosepak. -     predniSONE (STERAPRED UNI-PAK 21 TAB) 10 MG (21) TBPK tablet; Take by mouth daily. As directed x 6 days -     Ambulatory referral to Physical Therapy   Follow up plan: Return in about 3 months (around 03/21/2018). Assunta Found, MD [facility]

## 2017-12-19 NOTE — Patient Instructions (Signed)
Back Exercises If you have pain in your back, do these exercises 2-3 times each day or as told by your doctor. When the pain goes away, do the exercises once each day, but repeat the steps more times for each exercise (do more repetitions). If you do not have pain in your back, do these exercises once each day or as told by your doctor. Exercises Single Knee to Chest  Do these steps 3-5 times in a row for each leg: 1. Lie on your back on a firm bed or the floor with your legs stretched out. 2. Bring one knee to your chest. 3. Hold your knee to your chest by grabbing your knee or thigh. 4. Pull on your knee until you feel a gentle stretch in your lower back. 5. Keep doing the stretch for 10-30 seconds. 6. Slowly let go of your leg and straighten it.  Pelvic Tilt  Do these steps 5-10 times in a row: 1. Lie on your back on a firm bed or the floor with your legs stretched out. 2. Bend your knees so they point up to the ceiling. Your feet should be flat on the floor. 3. Tighten your lower belly (abdomen) muscles to press your lower back against the floor. This will make your tailbone point up to the ceiling instead of pointing down to your feet or the floor. 4. Stay in this position for 5-10 seconds while you gently tighten your muscles and breathe evenly.  Cat-Cow  Do these steps until your lower back bends more easily: 1. Get on your hands and knees on a firm surface. Keep your hands under your shoulders, and keep your knees under your hips. You may put padding under your knees. 2. Let your head hang down, and make your tailbone point down to the floor so your lower back is round like the back of a cat. 3. Stay in this position for 5 seconds. 4. Slowly lift your head and make your tailbone point up to the ceiling so your back hangs low (sags) like the back of a cow. 5. Stay in this position for 5 seconds.  Press-Ups  Do these steps 5-10 times in a row: 1. Lie on your belly (face-down)  on the floor. 2. Place your hands near your head, about shoulder-width apart. 3. While you keep your back relaxed and keep your hips on the floor, slowly straighten your arms to raise the top half of your body and lift your shoulders. Do not use your back muscles. To make yourself more comfortable, you may change where you place your hands. 4. Stay in this position for 5 seconds. 5. Slowly return to lying flat on the floor.  Bridges  Do these steps 10 times in a row: 1. Lie on your back on a firm surface. 2. Bend your knees so they point up to the ceiling. Your feet should be flat on the floor. 3. Tighten your butt muscles and lift your butt off of the floor until your waist is almost as high as your knees. If you do not feel the muscles working in your butt and the back of your thighs, slide your feet 1-2 inches farther away from your butt. 4. Stay in this position for 3-5 seconds. 5. Slowly lower your butt to the floor, and let your butt muscles relax.  If this exercise is too easy, try doing it with your arms crossed over your chest. Back Lifts Do these steps 5-10 times in a   row: 1. Lie on your belly (face-down) with your arms at your sides, and rest your forehead on the floor. 2. Tighten the muscles in your legs and your butt. 3. Slowly lift your chest off of the floor while you keep your hips on the floor. Keep the back of your head in line with the curve in your back. Look at the floor while you do this. 4. Stay in this position for 3-5 seconds. 5. Slowly lower your chest and your face to the floor.  Contact a doctor if:  Your back pain gets a lot worse when you do an exercise.  Your back pain does not lessen 2 hours after you exercise. If you have any of these problems, stop doing the exercises. Do not do them again unless your doctor says it is okay. Get help right away if:  You have sudden, very bad back pain. If this happens, stop doing the exercises. Do not do them again  unless your doctor says it is okay. This information is not intended to replace advice given to you by your health care provider. Make sure you discuss any questions you have with your health care provider. Document Released: 07/17/2010 Document Revised: 11/20/2015 Document Reviewed: 08/08/2014 Elsevier Interactive Patient Education  2018 [employer]   

## 2017-12-20 ENCOUNTER — Encounter: Payer: Self-pay | Admitting: Physical Therapy

## 2017-12-20 ENCOUNTER — Ambulatory Visit: Payer: BLUE CROSS/BLUE SHIELD | Attending: Pediatrics | Admitting: Physical Therapy

## 2017-12-20 ENCOUNTER — Other Ambulatory Visit: Payer: Self-pay

## 2017-12-20 DIAGNOSIS — M5442 Lumbago with sciatica, left side: Secondary | ICD-10-CM | POA: Diagnosis present

## 2017-12-20 NOTE — Therapy (Signed)
[facility] [place], [place], [address] Phone: [phone]   Fax:  [phone]  Physical Therapy Evaluation  Patient Details  Name: Erica [name] MRN: [id number] Date of Birth: 1951-06-10 Referring Provider: Assunta Found, MD   Encounter Date: 12/20/2017  PT End of Session - 12/20/17 1115    Visit Number  1    Number of Visits  12    Date for PT Re-Evaluation  02/07/18    Authorization Type  FOTO every 5th visit, progress note every 10th visit, KX modifier at 15th visit    PT Start Time  1031    PT Stop Time  1111    PT Time Calculation (min)  40 min    Activity Tolerance  Patient tolerated treatment well    Behavior During Therapy  [facility] for tasks assessed/performed       Past Medical History:  Diagnosis Date  . Complication of anesthesia   . Hyperlipidemia   . Osteopenia     Past Surgical History:  Procedure Laterality Date  . CHOLECYSTECTOMY N/A 05/14/2014   Procedure: LAPAROSCOPIC CHOLECYSTECTOMY WITH INTRAOPERATIVE CHOLANGIOGRAM;  Surgeon: Gayland Curry, MD;  Location: [facility];  Service: General;  Laterality: N/A;  . NECK SURGERY    . TONSILECTOMY/ADENOIDECTOMY WITH MYRINGOTOMY    . TUBAL LIGATION      There were no vitals filed for this visit.   Subjective Assessment - 12/20/17 1455    Subjective  Patient arrives to physical therapy with reports of ongoing low back pain with radiating pain to distal left thigh. Patient reports pain began insidiously about 3 months ago and reports pain and difficulty with bending over and performing ADLs and home activities. Patient reports pain at worst during those activities as 7/10. Pain at best is 0/10 with prednisone. Patient reports pain does not limit her performing her usual activities, but it does limit her playing golf. Patient's goals are to improve strength, improve motion, and return to recreational activities.     Limitations  House hold activities    How long  can you sit comfortably?  unlimited    How long can you stand comfortably?  unlimited    How long can you walk comfortably?  unlimited    Patient Stated Goals  return to golf, independence with exercises    Currently in Pain?  Yes    Pain Score  1     Pain Location  Back    Pain Orientation  Lower;Left    Pain Descriptors / Indicators  Sore;Aching    Pain Type  Chronic pain    Pain Onset  More than a month ago    Pain Frequency  Intermittent    Aggravating Factors   bending over    Pain Relieving Factors  medication, not over doing activities         [facility] PT Assessment - 12/20/17 0001      Assessment   Medical Diagnosis  scatica of left side    Referring Provider  Assunta Found, MD    Onset Date/Surgical Date  -- 3 months ago    Next MD Visit  n/a    Prior Therapy  no      Balance Screen   Has the patient fallen in the past 6 months  No    Has the patient had a decrease in activity level because of a fear of falling?   No    Is the patient reluctant to leave their  home because of a fear of falling?   No      Home Film/video editor residence    Living Arrangements  Alone      Prior Function   Level of Independence  Independent    Leisure  golf      ROM / Strength   AROM / PROM / Strength  AROM;Strength      AROM   AROM Assessment Site  Lumbar    Lumbar Flexion  can touch toes    Lumbar Extension  limited by 25%    Lumbar - Right Side Bend  20 inches finger tip to floor    Lumbar - Left Side Bend  21 inches finger tip to floor      Strength   Strength Assessment Site  Hip;Knee    Right/Left Hip  Right;Left    Right Hip Flexion  4-/5    Right Hip Extension  4/5    Right Hip ABduction  4/5    Left Hip Flexion  3+/5    Left Hip Extension  4/5    Left Hip ABduction  4/5    Right/Left Knee  Right;Left    Right Knee Flexion  4+/5    Right Knee Extension  4+/5    Left Knee Flexion  4+/5    Left Knee Extension  4+/5      Palpation    Palpation comment  no marked tenderness to palpation in low back or glutes      Transfers   Transfers  Independent with all Transfers      Ambulation/Gait   Gait Pattern  Within Functional Limits                Objective measurements completed on examination: See above findings.                   PT Long Term Goals - 12/20/17 1449      PT LONG TERM GOAL #1   Title  Patient will be independent with HEP    Time  6    Period  Weeks    Status  New      PT LONG TERM GOAL #2   Title  Patient will improve bilateral LE strength to 4+/5 or greater to improve stability during functional activities.    Time  6    Period  Weeks    Status  New      PT LONG TERM GOAL #3   Title  Patient will golf 8 or greater holes less than 3/10 pain in low back.     Time  6    Period  Weeks    Status  New      PT LONG TERM GOAL #4   Title  Patient will demonstrate proper lifting body mechanics to protect back during yard work.     Time  6    Period  Weeks    Status  New             Plan - 12/20/17 1456    Clinical Impression Statement  Patient is a 67 year old female who presents to physical therapy with reports of low back pain and radiating pain to distal- anterior left thigh. Patient demonstrates good LE strength. Patient noted with limited AROM lumbar extension. Patient did not report any marked areas of tenderness upon palpation. Patient noted with increased lumbar lordosis. Patient noted with equal leg lengths and ASIS. Patient  denies numbness and tingling and denies pain with left SLR test. Patient would benefit from skilled physical therapy to decrease pain, improve mobility, and address patient's goals.     Clinical Presentation  Stable    Clinical Decision Making  Low    Rehab Potential  Excellent    PT Frequency  2x / week    PT Duration  6 weeks    PT Treatment/Interventions  ADLs/Self Care Home Management;Iontophoresis 4mg /ml  Dexamethasone;Cryotherapy;Electrical Stimulation;Traction;Ultrasound;Moist Heat;Therapeutic exercise;Therapeutic activities;Balance training;Stair training;Neuromuscular re-education;Gait training;Patient/family education;Manual techniques;Passive range of motion;Taping;Dry needling;Energy conservation    PT Next Visit Plan  FOTO; Nustep, core strengthing and LE strengthening exercises; modalities PRN for pain relief    PT Home Exercise Plan  draw ins bridges, scapular retractions, body mechanics for lifting and bending.    Consulted and Agree with Plan of Care  Patient       Patient will benefit from skilled therapeutic intervention in order to improve the following deficits and impairments:  Pain, Decreased activity tolerance, Decreased endurance, Decreased range of motion, Decreased strength, Postural dysfunction  Visit Diagnosis: Midline low back pain with left-sided sciatica, unspecified chronicity     Problem List Patient Active Problem List   Diagnosis Date Noted  . BMI 27.0-27.9,adult 10/21/2015  . Osteopenia 03/11/2014  . Gastroesophageal reflux disease without esophagitis 01/15/2014  . Hyperlipidemia with target LDL less than 100 01/15/2014   Gabriela Eves, PT, DPT 12/20/2017, 3:00 PM  [facility] [address] [place], [place], [address] Phone: [phone]   Fax:  [phone]  Name: GICELA SCHWARTING MRN: [id number] Date of Birth: Mar 25, 1951

## 2017-12-28 ENCOUNTER — Ambulatory Visit: Payer: BLUE CROSS/BLUE SHIELD | Attending: Pediatrics | Admitting: Physical Therapy

## 2017-12-28 ENCOUNTER — Encounter: Payer: Self-pay | Admitting: Physical Therapy

## 2017-12-28 DIAGNOSIS — M5442 Lumbago with sciatica, left side: Secondary | ICD-10-CM

## 2017-12-28 NOTE — Therapy (Signed)
[facility] [place], [place], [address] Phone: [phone]   Fax:  [phone]  Physical Therapy Treatment  Patient Details  Name: Erica [name] MRN: [id number] Date of Birth: 1950/12/27 Referring Provider: Assunta Found, MD   Encounter Date: 12/28/2017  PT End of Session - 12/28/17 0800    Visit Number  2    Number of Visits  12    Date for PT Re-Evaluation  02/07/18    Authorization Type  FOTO every 5th visit, progress note every 10th visit, KX modifier at 15th visit    PT Start Time  0732    PT Stop Time  0814    PT Time Calculation (min)  42 min    Activity Tolerance  Patient tolerated treatment well    Behavior During Therapy  [facility] for tasks assessed/performed       Past Medical History:  Diagnosis Date  . Complication of anesthesia   . Hyperlipidemia   . Osteopenia     Past Surgical History:  Procedure Laterality Date  . CHOLECYSTECTOMY N/A 05/14/2014   Procedure: LAPAROSCOPIC CHOLECYSTECTOMY WITH INTRAOPERATIVE CHOLANGIOGRAM;  Surgeon: Gayland Curry, MD;  Location: [place];  Service: General;  Laterality: N/A;  . NECK SURGERY    . TONSILECTOMY/ADENOIDECTOMY WITH MYRINGOTOMY    . TUBAL LIGATION      There were no vitals filed for this visit.  Subjective Assessment - 12/28/17 0735    Subjective  Reports that her back feels okay this morning.    Limitations  House hold activities    How long can you sit comfortably?  unlimited    How long can you stand comfortably?  unlimited    How long can you walk comfortably?  unlimited    Patient Stated Goals  return to golf, independence with exercises    Currently in Pain?  No/denies         [facility] PT Assessment - 12/28/17 0001      Assessment   Medical Diagnosis  scatica of left side    Next MD Visit  n/a    Prior Therapy  no                   [facility] Adult PT Treatment/Exercise - 12/28/17 0001      Exercises   Exercises  Lumbar       Lumbar Exercises: Aerobic   Nustep  L4 x14 min      Lumbar Exercises: Standing   Forward Lunge  10 reps 4# reachout    Row  Strengthening;Both;20 reps;Limitations    Row Limitations  Pink XTS with core    Shoulder Extension  Strengthening;Both;20 reps;Limitations    Shoulder Extension Limitations  Pink XTS with core activation    Other Standing Lumbar Exercises  B chop wood pink XTS x15 reps      Lumbar Exercises: Supine   Bridge  20 reps;3 seconds             PT Education - 12/28/17 0823    Education Details  HEP- posture, ADLs    Person(s) Educated  Patient    Methods  Explanation;Demonstration;Handout    Comprehension  Verbalized understanding          PT Long Term Goals - 12/20/17 1449      PT LONG TERM GOAL #1   Title  Patient will be independent with HEP    Time  6    Period  Weeks    Status  New      PT LONG TERM GOAL #2   Title  Patient will improve bilateral LE strength to 4+/5 or greater to improve stability during functional activities.    Time  6    Period  Weeks    Status  New      PT LONG TERM GOAL #3   Title  Patient will golf 8 or greater holes less than 3/10 pain in low back.     Time  6    Period  Weeks    Status  New      PT LONG TERM GOAL #4   Title  Patient will demonstrate proper lifting body mechanics to protect back during yard work.     Time  6    Period  Weeks    Status  New            Plan - 12/28/17 0973    Clinical Impression Statement  Patient tolerated today's treatment well as she arrived with no pain and only complaints of pain in L lateral thigh. Patient encouraged throughout treatment to complete exercises with core draw in including functional activities and ADLs at home. Patient instructed in golfer's pickup for lumbar protection. Patient progressed through some advanced core and lumbar strengthening exercises without complaint of any increased pain. Patient able to demonstrate appropriate technique as well  following initial demo and [place]. Posture and ADLs handout also provided during today's treatment with education regarding all techniques. No modalities completed secondary to no pain. Patient educated to utilize heating pad or ice if any discomfort presents.    Rehab Potential  Excellent    PT Frequency  2x / week    PT Duration  6 weeks    PT Treatment/Interventions  ADLs/Self Care Home Management;Iontophoresis 4mg /ml Dexamethasone;Cryotherapy;Electrical Stimulation;Traction;Ultrasound;Moist Heat;Therapeutic exercise;Therapeutic activities;Balance training;Stair training;Neuromuscular re-education;Gait training;Patient/family education;Manual techniques;Passive range of motion;Taping;Dry needling;Energy conservation    PT Next Visit Plan  FOTO; Nustep, core strengthing and LE strengthening exercises; modalities PRN for pain relief    PT Home Exercise Plan  draw ins bridges, scapular retractions, body mechanics for lifting and bending.    Consulted and Agree with Plan of Care  Patient       Patient will benefit from skilled therapeutic intervention in order to improve the following deficits and impairments:  Pain, Decreased activity tolerance, Decreased endurance, Decreased range of motion, Decreased strength, Postural dysfunction  Visit Diagnosis: Midline low back pain with left-sided sciatica, unspecified chronicity     Problem List Patient Active Problem List   Diagnosis Date Noted  . BMI 27.0-27.9,adult 10/21/2015  . Osteopenia 03/11/2014  . Gastroesophageal reflux disease without esophagitis 01/15/2014  . Hyperlipidemia with target LDL less than 100 01/15/2014    Standley Brooking, PTA 12/28/2017, 8:43 AM  [facility] [address] [place], [place], [address] Phone: [phone]   Fax:  [phone]  Name: AZURA TUFARO MRN: [id number] Date of Birth: 01/08/1951

## 2017-12-28 NOTE — Patient Instructions (Signed)

## 2018-01-03 ENCOUNTER — Encounter: Payer: Self-pay | Admitting: Physical Therapy

## 2018-01-03 ENCOUNTER — Ambulatory Visit: Payer: BLUE CROSS/BLUE SHIELD | Admitting: Physical Therapy

## 2018-01-03 DIAGNOSIS — M5442 Lumbago with sciatica, left side: Secondary | ICD-10-CM

## 2018-01-03 NOTE — Therapy (Signed)
[facility] [place], [place], [address] Phone: [phone]   Fax:  [phone]  Physical Therapy Treatment  Patient Details  Name: Erica [name] MRN: [id number] Date of Birth: 05-19-1951 Referring Provider: Assunta Found, MD   Encounter Date: 01/03/2018  PT End of Session - 01/03/18 0729    Visit Number  3    Number of Visits  12    Date for PT Re-Evaluation  02/07/18    Authorization Type  FOTO every 5th visit, progress note every 10th visit, KX modifier at 15th visit    PT Start Time  0732    PT Stop Time  0814    PT Time Calculation (min)  42 min    Activity Tolerance  Patient tolerated treatment well    Behavior During Therapy  [facility] for tasks assessed/performed       Past Medical History:  Diagnosis Date  . Complication of anesthesia   . Hyperlipidemia   . Osteopenia     Past Surgical History:  Procedure Laterality Date  . CHOLECYSTECTOMY N/A 05/14/2014   Procedure: LAPAROSCOPIC CHOLECYSTECTOMY WITH INTRAOPERATIVE CHOLANGIOGRAM;  Surgeon: Gayland Curry, MD;  Location: [place];  Service: General;  Laterality: N/A;  . NECK SURGERY    . TONSILECTOMY/ADENOIDECTOMY WITH MYRINGOTOMY    . TUBAL LIGATION      There were no vitals filed for this visit.  Subjective Assessment - 01/03/18 0729    Subjective  Reports that she still has some discomfort in L hip area. Reports that she did not have any pain playing golf last week. Reports that she did not stretch prior to PT this morning.    Limitations  House hold activities    How long can you sit comfortably?  unlimited    How long can you stand comfortably?  unlimited    How long can you walk comfortably?  unlimited    Patient Stated Goals  return to golf, independence with exercises    Currently in Pain?  No/denies         [facility] PT Assessment - 01/03/18 0001      Assessment   Medical Diagnosis  scatica of left side    Next MD Visit  n/a    Prior Therapy  no                    [facility] Adult PT Treatment/Exercise - 01/03/18 0001      Lumbar Exercises: Stretches   Single Knee to Chest Stretch  Right;Left;3 reps;30 seconds    Lower Trunk Rotation  5 reps;10 seconds    Figure 4 Stretch  3 reps;30 seconds;Supine;With overpressure      Lumbar Exercises: Aerobic   Nustep  L4 x16 min      Lumbar Exercises: Standing   Forward Lunge  15 reps;Other (comment) 4# reachouts    Row  Strengthening;Both;20 reps;Limitations    Row Limitations  Pink XTS with core activation    Shoulder Extension  Strengthening;Both;20 reps;Limitations    Shoulder Extension Limitations  Pink XTS with core activation    Other Standing Lumbar Exercises  B chop wood pink XTS x30 reps      Lumbar Exercises: Supine   Bridge  20 reps;3 seconds      Lumbar Exercises: Sidelying   Clam  Both;20 reps                  PT Long Term Goals - 01/03/18 7371  PT LONG TERM GOAL #1   Title  Patient will be independent with HEP    Time  6    Period  Weeks    Status  Achieved      PT LONG TERM GOAL #2   Title  Patient will improve bilateral LE strength to 4+/5 or greater to improve stability during functional activities.    Time  6    Period  Weeks    Status  On-going      PT LONG TERM GOAL #3   Title  Patient will golf 8 or greater holes less than 3/10 pain in low back.     Time  6    Period  Weeks    Status  On-going      PT LONG TERM GOAL #4   Title  Patient will demonstrate proper lifting body mechanics to protect back during yard work.     Time  6    Period  Weeks    Status  On-going            Plan - 01/03/18 0818    Clinical Impression Statement  Patient tolerated today's treatment well as she reports only intermittant L hip discomfort. Patient guided through more functional back and core exercises today without complaint of pain. Patient then guided through lumbar and hip stretches with patient verbalizing she felt less restricted  following Piriformis and lower trunk rotation. Patient accepted that she would add those to her other stretches at home and did not wish for handout for the stretches. No modalities completed secondary to no pain. Patient reported feeling "better" upon end of treatment.    Rehab Potential  Excellent    PT Frequency  2x / week    PT Duration  6 weeks    PT Treatment/Interventions  ADLs/Self Care Home Management;Iontophoresis 4mg /ml Dexamethasone;Cryotherapy;Electrical Stimulation;Traction;Ultrasound;Moist Heat;Therapeutic exercise;Therapeutic activities;Balance training;Stair training;Neuromuscular re-education;Gait training;Patient/family education;Manual techniques;Passive range of motion;Taping;Dry needling;Energy conservation    PT Next Visit Plan  FOTO; Nustep, core strengthing and LE strengthening exercises; modalities PRN for pain relief    PT Home Exercise Plan  draw ins bridges, scapular retractions, body mechanics for lifting and bending.    Consulted and Agree with Plan of Care  Patient       Patient will benefit from skilled therapeutic intervention in order to improve the following deficits and impairments:  Pain, Decreased activity tolerance, Decreased endurance, Decreased range of motion, Decreased strength, Postural dysfunction  Visit Diagnosis: Midline low back pain with left-sided sciatica, unspecified chronicity     Problem List Patient Active Problem List   Diagnosis Date Noted  . BMI 27.0-27.9,adult 10/21/2015  . Osteopenia 03/11/2014  . Gastroesophageal reflux disease without esophagitis 01/15/2014  . Hyperlipidemia with target LDL less than 100 01/15/2014    Standley Brooking, PTA 01/03/2018, 9:10 AM  [facility] [place], [place], [address] Phone: [phone]   Fax:  [phone]  Name: TAKHIA SPOON MRN: [id number] Date of Birth: 03/02/1951

## 2018-01-10 ENCOUNTER — Encounter: Payer: Medicare Other | Admitting: Physical Therapy

## 2018-01-11 ENCOUNTER — Ambulatory Visit: Payer: BLUE CROSS/BLUE SHIELD | Admitting: Physical Therapy

## 2018-01-11 ENCOUNTER — Encounter: Payer: Self-pay | Admitting: Physical Therapy

## 2018-01-11 DIAGNOSIS — M5442 Lumbago with sciatica, left side: Secondary | ICD-10-CM | POA: Diagnosis not present

## 2018-01-11 NOTE — Therapy (Signed)
[facility] [place], [place], [address] Phone: [phone]   Fax:  [phone]  Physical Therapy Treatment  Patient Details  Name: Erica [name] MRN: [id number] Date of Birth: 1951/01/23 Referring Provider: Assunta Found, MD   Encounter Date: 01/11/2018  PT End of Session - 01/11/18 0731    Visit Number  4    Number of Visits  12    Date for PT Re-Evaluation  02/07/18    Authorization Type  FOTO every 5th visit, progress note every 10th visit, KX modifier at 15th visit    PT Start Time  0730    PT Stop Time  0813    PT Time Calculation (min)  43 min    Activity Tolerance  Patient tolerated treatment well    Behavior During Therapy  [facility] for tasks assessed/performed       Past Medical History:  Diagnosis Date  . Complication of anesthesia   . Hyperlipidemia   . Osteopenia     Past Surgical History:  Procedure Laterality Date  . CHOLECYSTECTOMY N/A 05/14/2014   Procedure: LAPAROSCOPIC CHOLECYSTECTOMY WITH INTRAOPERATIVE CHOLANGIOGRAM;  Surgeon: Gayland Curry, MD;  Location: [place];  Service: General;  Laterality: N/A;  . NECK SURGERY    . TONSILECTOMY/ADENOIDECTOMY WITH MYRINGOTOMY    . TUBAL LIGATION      There were no vitals filed for this visit.  Subjective Assessment - 01/11/18 0731    Subjective  Denies any pain or problem upon arrival. Reports that the stretches from last treatment really help and has been doing them 2x/day.    Limitations  House hold activities    How long can you sit comfortably?  unlimited    How long can you stand comfortably?  unlimited    How long can you walk comfortably?  unlimited    Patient Stated Goals  return to golf, independence with exercises    Currently in Pain?  No/denies         [facility] PT Assessment - 01/11/18 0001      Assessment   Medical Diagnosis  scatica of left side    Next MD Visit  n/a    Prior Therapy  no                   [facility] Adult PT  Treatment/Exercise - 01/11/18 0001      Lumbar Exercises: Aerobic   Nustep  L4 x17 min      Lumbar Exercises: Standing   Forward Lunge  20 reps;Other (comment) 4# reachouts    Row  Strengthening;Both;20 reps;Limitations    Row Limitations  Pink XTS with core activation    Shoulder Extension  Strengthening;Both;20 reps;Limitations    Shoulder Extension Limitations  Pink XTS with core activation    Other Standing Lumbar Exercises  B chop wood pink XTS x30 reps    Other Standing Lumbar Exercises  B PNF in standing 4# x15 reps each      Lumbar Exercises: Supine   Clam  20 reps;Other (comment) Green theraband    Bridge  20 reps;3 seconds    Straight Leg Raise  20 reps                  PT Long Term Goals - 01/03/18 0909      PT LONG TERM GOAL #1   Title  Patient will be independent with HEP    Time  6    Period  Weeks  Status  Achieved      PT LONG TERM GOAL #2   Title  Patient will improve bilateral LE strength to 4+/5 or greater to improve stability during functional activities.    Time  6    Period  Weeks    Status  On-going      PT LONG TERM GOAL #3   Title  Patient will golf 8 or greater holes less than 3/10 pain in low back.     Time  6    Period  Weeks    Status  On-going      PT LONG TERM GOAL #4   Title  Patient will demonstrate proper lifting body mechanics to protect back during yard work.     Time  6    Period  Weeks    Status  On-going            Plan - 01/11/18 0815    Clinical Impression Statement  Patient tolerated today's treatment well with no complaints of pain upon arrival or with golf, ADLs. Patient encouraged throughout treatment for core activation. Patient able to demonstrate good technique with all exercises following initial instruction. No complaints upon end of session.     Rehab Potential  Excellent    PT Frequency  2x / week    PT Duration  6 weeks    PT Treatment/Interventions  ADLs/Self Care Home  Management;Iontophoresis 4mg /ml Dexamethasone;Cryotherapy;Electrical Stimulation;Traction;Ultrasound;Moist Heat;Therapeutic exercise;Therapeutic activities;Balance training;Stair training;Neuromuscular re-education;Gait training;Patient/family education;Manual techniques;Passive range of motion;Taping;Dry needling;Energy conservation    PT Next Visit Plan  FOTO; Nustep, core strengthing and LE strengthening exercises; modalities PRN for pain relief    PT Home Exercise Plan  draw ins bridges, scapular retractions, body mechanics for lifting and bending.    Consulted and Agree with Plan of Care  Patient       Patient will benefit from skilled therapeutic intervention in order to improve the following deficits and impairments:  Pain, Decreased activity tolerance, Decreased endurance, Decreased range of motion, Decreased strength, Postural dysfunction  Visit Diagnosis: Midline low back pain with left-sided sciatica, unspecified chronicity     Problem List Patient Active Problem List   Diagnosis Date Noted  . BMI 27.0-27.9,adult 10/21/2015  . Osteopenia 03/11/2014  . Gastroesophageal reflux disease without esophagitis 01/15/2014  . Hyperlipidemia with target LDL less than 100 01/15/2014    Standley Brooking, PTA 01/11/2018, 8:34 AM  [facility] [address] [place], [place], [address] Phone: [phone]   Fax:  [phone]  Name: HEND MCCARRELL MRN: [id number] Date of Birth: [birth year]

## 2018-01-16 ENCOUNTER — Encounter: Payer: Self-pay | Admitting: Physical Therapy

## 2018-01-16 ENCOUNTER — Ambulatory Visit: Payer: BLUE CROSS/BLUE SHIELD | Admitting: Physical Therapy

## 2018-01-16 DIAGNOSIS — M5442 Lumbago with sciatica, left side: Secondary | ICD-10-CM | POA: Diagnosis not present

## 2018-01-16 NOTE — Therapy (Signed)
[facility] [place], [place], [address] Phone: [phone]   Fax:  [phone]  Physical Therapy Treatment  Patient Details  Name: Erica [name] MRN: [id number] Date of Birth: [birth year] Referring Provider: Assunta Found, MD   Encounter Date: 01/16/2018  PT End of Session - 01/16/18 0730    Visit Number  5    Number of Visits  12    Date for PT Re-Evaluation  02/07/18    Authorization Type  FOTO every 5th visit, progress note every 10th visit, KX modifier at 15th visit    PT Start Time  0730    PT Stop Time  0817    PT Time Calculation (min)  47 min    Activity Tolerance  Patient tolerated treatment well    Behavior During Therapy  [facility] for tasks assessed/performed       Past Medical History:  Diagnosis Date  . Complication of anesthesia   . Hyperlipidemia   . Osteopenia     Past Surgical History:  Procedure Laterality Date  . CHOLECYSTECTOMY N/A 05/14/2014   Procedure: LAPAROSCOPIC CHOLECYSTECTOMY WITH INTRAOPERATIVE CHOLANGIOGRAM;  Surgeon: Gayland Curry, MD;  Location: [place];  Service: General;  Laterality: N/A;  . NECK SURGERY    . TONSILECTOMY/ADENOIDECTOMY WITH MYRINGOTOMY    . TUBAL LIGATION      There were no vitals filed for this visit.  Subjective Assessment - 01/16/18 0732    Subjective  Patient reports no pain at start of session but reported last week's session made her hips sore which made it difficult for her to walk.    Limitations  House hold activities    How long can you sit comfortably?  unlimited    How long can you stand comfortably?  unlimited    How long can you walk comfortably?  unlimited    Patient Stated Goals  return to golf, independence with exercises    Currently in Pain?  No/denies         [facility] PT Assessment - 01/16/18 0001      Assessment   Medical Diagnosis  scatica of left side    Next MD Visit  n/a    Prior Therapy  no      Strength   Right Hip Flexion  4+/5     Right Hip ABduction  4/5    Left Hip Flexion  4+/5    Left Hip ABduction  4/5    Right Knee Flexion  4+/5    Right Knee Extension  5/5    Left Knee Flexion  4+/5    Left Knee Extension  5/5                   [facility] Adult PT Treatment/Exercise - 01/16/18 0001      Lumbar Exercises: Stretches   Lower Trunk Rotation  5 reps;10 seconds      Lumbar Exercises: Aerobic   Nustep  L5 x17 min      Lumbar Exercises: Standing   Row  Strengthening;Both;20 reps;Limitations    Row Limitations  Pink XTS    Shoulder Extension  Strengthening;Both;20 reps;Limitations    Shoulder Extension Limitations  Pink XTS      Lumbar Exercises: Supine   Bridge with clamshell  20 reps;2 seconds green TB    Straight Leg Raise  20 reps      Lumbar Exercises: Sidelying   Hip Abduction  Both;20 reps      Lumbar Exercises: Prone  Straight Leg Raise  20 reps each LE                  PT Long Term Goals - 01/16/18 0810      PT LONG TERM GOAL #1   Title  Patient will be independent with HEP    Time  6    Period  Weeks    Status  Achieved      PT LONG TERM GOAL #2   Title  Patient will improve bilateral LE strength to 4+/5 or greater to improve stability during functional activities.    Time  6    Period  Weeks    Status  On-going      PT LONG TERM GOAL #3   Title  Patient will golf 8 or greater holes less than 3/10 pain in low back.     Time  6    Period  Weeks    Status  Achieved 18 holes, no pain      PT LONG TERM GOAL #4   Title  Patient will demonstrate proper lifting body mechanics to protect back during yard work.     Time  6    Period  Weeks    Status  On-going            Plan - 01/16/18 0807    Clinical Impression Statement  Patient was able to tolerate treatment well with minimal complaints of discomfort in L LE with supine SLR. Patient was able to demonstrate good form demonstration. Patient still reports intermittent neurological symptoms to L  hip/glute area but reports it has made improvements since the start of physical therapy. Goals ongoing.    Clinical Presentation  Stable    Clinical Decision Making  Low    Rehab Potential  Excellent    PT Frequency  2x / week    PT Duration  6 weeks    PT Treatment/Interventions  ADLs/Self Care Home Management;Iontophoresis 4mg /ml Dexamethasone;Cryotherapy;Electrical Stimulation;Traction;Ultrasound;Moist Heat;Therapeutic exercise;Therapeutic activities;Balance training;Stair training;Neuromuscular re-education;Gait training;Patient/family education;Manual techniques;Passive range of motion;Taping;Dry needling;Energy conservation    PT Next Visit Plan  Assess lifting body mechanics and address any compensations. Nustep, core strengthing and LE strengthening exercises; modalities PRN for pain relief    Consulted and Agree with Plan of Care  Patient       Patient will benefit from skilled therapeutic intervention in order to improve the following deficits and impairments:  Pain, Decreased activity tolerance, Decreased endurance, Decreased range of motion, Decreased strength, Postural dysfunction  Visit Diagnosis: Midline low back pain with left-sided sciatica, unspecified chronicity     Problem List Patient Active Problem List   Diagnosis Date Noted  . BMI 27.0-27.9,adult 10/21/2015  . Osteopenia 03/11/2014  . Gastroesophageal reflux disease without esophagitis 01/15/2014  . Hyperlipidemia with target LDL less than 100 01/15/2014   Gabriela Eves, PT, DPT 01/16/2018, 8:21 AM  [facility] [place], [place], [address] Phone: [phone]   Fax:  [phone]  Name: Erica [name] MRN: [id number] Date of Birth: Jul 10, 1950

## 2018-01-24 ENCOUNTER — Ambulatory Visit: Payer: BLUE CROSS/BLUE SHIELD | Admitting: Physical Therapy

## 2018-01-24 ENCOUNTER — Encounter: Payer: Self-pay | Admitting: Physical Therapy

## 2018-01-24 DIAGNOSIS — M5442 Lumbago with sciatica, left side: Secondary | ICD-10-CM

## 2018-01-24 NOTE — Therapy (Signed)
[facility] [place], [place], [address] Phone: [phone]   Fax:  [phone]  Physical Therapy Treatment  Patient Details  Name: Erica [name] MRN: [id number] Date of Birth: [birth year] Referring Provider: Assunta Found, MD   Encounter Date: 01/24/2018  PT End of Session - 01/24/18 0742    Visit Number  6    Number of Visits  12    Date for PT Re-Evaluation  02/07/18    Authorization Type  FOTO every 5th visit, progress note every 10th visit, KX modifier at 15th visit    PT Start Time  0736    PT Stop Time  0809    PT Time Calculation (min)  33 min    Activity Tolerance  Patient tolerated treatment well    Behavior During Therapy  [facility] for tasks assessed/performed       Past Medical History:  Diagnosis Date  . Complication of anesthesia   . Hyperlipidemia   . Osteopenia     Past Surgical History:  Procedure Laterality Date  . CHOLECYSTECTOMY N/A 05/14/2014   Procedure: LAPAROSCOPIC CHOLECYSTECTOMY WITH INTRAOPERATIVE CHOLANGIOGRAM;  Surgeon: Gayland Curry, MD;  Location: [facility];  Service: General;  Laterality: N/A;  . NECK SURGERY    . TONSILECTOMY/ADENOIDECTOMY WITH MYRINGOTOMY    . TUBAL LIGATION      There were no vitals filed for this visit.  Subjective Assessment - 01/24/18 0741    Subjective  No problems with golf or household activities.    Limitations  House hold activities    How long can you sit comfortably?  unlimited    How long can you stand comfortably?  unlimited    How long can you walk comfortably?  unlimited    Patient Stated Goals  return to golf, independence with exercises    Currently in Pain?  No/denies         [facility] PT Assessment - 01/24/18 0001      Assessment   Medical Diagnosis  scatica of left side    Next MD Visit  n/a    Prior Therapy  no      ROM / Strength   AROM / PROM / Strength  Strength      Strength   Overall Strength  Within functional limits for tasks  performed    Strength Assessment Site  Hip;Knee    Right/Left Hip  Right;Left    Right Hip Flexion  4+/5    Right Hip ABduction  4+/5    Left Hip Flexion  4+/5    Left Hip ABduction  4+/5    Right/Left Knee  Right;Left    Right Knee Flexion  4+/5    Right Knee Extension  5/5    Left Knee Flexion  4+/5    Left Knee Extension  5/5                   [facility] Adult PT Treatment/Exercise - 01/24/18 0001      Therapeutic Activites    Therapeutic Activities  Lifting    Lifting  Lifting from waist, knee height and floor 14# box       Lumbar Exercises: Aerobic   Nustep  L4 x15 min      Lumbar Exercises: Standing   Row  Strengthening;Both;Limitations    Row Limitations  Pink XTS core activation 3x10 reps    Shoulder Extension  Strengthening Pink XTS core activation 3x10 reps    Other Standing Lumbar Exercises  B chop wood pink XTS x30 reps                  PT Long Term Goals - 01/24/18 0805      PT LONG TERM GOAL #1   Title  Patient will be independent with HEP    Time  6    Period  Weeks    Status  Achieved      PT LONG TERM GOAL #2   Title  Patient will improve bilateral LE strength to 4+/5 or greater to improve stability during functional activities.    Time  6    Period  Weeks    Status  Achieved      PT LONG TERM GOAL #3   Title  Patient will golf 8 or greater holes less than 3/10 pain in low back.     Time  6    Period  Weeks    Status  Achieved 18 holes, no pain      PT LONG TERM GOAL #4   Title  Patient will demonstrate proper lifting body mechanics to protect back during yard work.     Time  6    Period  Weeks    Status  Achieved            Plan - 01/24/18 5670    Clinical Impression Statement  Patient arrived in clinic with no complaints of golf, ADLs, or any activities around her home. Patient guided through core/lumbar strengthening with VCs for core activation. Lifting technique assessed and properly demonstrated to patient today  in clinic at various heights. Patient able to complete within proper technique and no complaints. Minimal VCs required for patient to bring box to her chest. Patient reports compliance with HEP and ADLs handout she received. BLE MMT assessed as 4+/5-5/5 throughout. All goals achieved at this time. Patient encouraged to continue core activation with activities as well as posture/lifting techniques and golfers pickup.     Rehab Potential  Excellent    PT Frequency  2x / week    PT Duration  6 weeks    PT Treatment/Interventions  ADLs/Self Care Home Management;Iontophoresis 13m/ml Dexamethasone;Cryotherapy;Electrical Stimulation;Traction;Ultrasound;Moist Heat;Therapeutic exercise;Therapeutic activities;Balance training;Stair training;Neuromuscular re-education;Gait training;Patient/family education;Manual techniques;Passive range of motion;Taping;Dry needling;Energy conservation    PT Next Visit Plan  D/C summary required.    PT Home Exercise Plan  draw ins bridges, scapular retractions, body mechanics for lifting and bending.    Consulted and Agree with Plan of Care  Patient       Patient will benefit from skilled therapeutic intervention in order to improve the following deficits and impairments:  Pain, Decreased activity tolerance, Decreased endurance, Decreased range of motion, Decreased strength, Postural dysfunction  Visit Diagnosis: Midline low back pain with left-sided sciatica, unspecified chronicity     Problem List Patient Active Problem List   Diagnosis Date Noted  . BMI 27.0-27.9,adult 10/21/2015  . Osteopenia 03/11/2014  . Gastroesophageal reflux disease without esophagitis 01/15/2014  . Hyperlipidemia with target LDL less than 100 01/15/2014   PHYSICAL THERAPY DISCHARGE SUMMARY  Visits from Start of Care: 6   Current functional level related to goals / functional outcomes: See above   Remaining deficits: All goals met   Education / Equipment: HEP Plan: Patient  agrees to discharge.  Patient goals were met. Patient is being discharged due to meeting the stated rehab goals.  ?????        KStandley Brooking PTA 01/24/18 8:37 AM  [facility] [place], [place], [address] Phone: [phone]   Fax:  [phone]  Name: Erica [name] MRN: [id number] Date of Birth: 1951/02/15

## 2018-02-20 ENCOUNTER — Other Ambulatory Visit: Payer: Self-pay | Admitting: Nurse Practitioner

## 2018-02-20 DIAGNOSIS — E785 Hyperlipidemia, unspecified: Secondary | ICD-10-CM

## 2018-02-20 NOTE — Telephone Encounter (Signed)
Last lipid 02/01/17

## 2018-03-10 ENCOUNTER — Ambulatory Visit (INDEPENDENT_AMBULATORY_CARE_PROVIDER_SITE_OTHER): Payer: BLUE CROSS/BLUE SHIELD

## 2018-03-10 ENCOUNTER — Encounter: Payer: Self-pay | Admitting: Nurse Practitioner

## 2018-03-10 ENCOUNTER — Ambulatory Visit (INDEPENDENT_AMBULATORY_CARE_PROVIDER_SITE_OTHER): Payer: BLUE CROSS/BLUE SHIELD | Admitting: Nurse Practitioner

## 2018-03-10 VITALS — BP 143/77 | HR 61 | Temp 97.0°F | Ht 64.0 in | Wt 163.0 lb

## 2018-03-10 DIAGNOSIS — M25552 Pain in left hip: Secondary | ICD-10-CM

## 2018-03-10 DIAGNOSIS — Z23 Encounter for immunization: Secondary | ICD-10-CM | POA: Diagnosis not present

## 2018-03-10 DIAGNOSIS — Z87891 Personal history of nicotine dependence: Secondary | ICD-10-CM

## 2018-03-10 DIAGNOSIS — Z0000 Encounter for general adult medical examination without abnormal findings: Secondary | ICD-10-CM | POA: Diagnosis not present

## 2018-03-10 DIAGNOSIS — Z6827 Body mass index (BMI) 27.0-27.9, adult: Secondary | ICD-10-CM

## 2018-03-10 DIAGNOSIS — M8580 Other specified disorders of bone density and structure, unspecified site: Secondary | ICD-10-CM

## 2018-03-10 DIAGNOSIS — Z8742 Personal history of other diseases of the female genital tract: Secondary | ICD-10-CM

## 2018-03-10 DIAGNOSIS — E785 Hyperlipidemia, unspecified: Secondary | ICD-10-CM | POA: Diagnosis not present

## 2018-03-10 DIAGNOSIS — M8588 Other specified disorders of bone density and structure, other site: Secondary | ICD-10-CM

## 2018-03-10 DIAGNOSIS — K219 Gastro-esophageal reflux disease without esophagitis: Secondary | ICD-10-CM

## 2018-03-10 MED ORDER — ATORVASTATIN CALCIUM 40 MG PO TABS: 40.0000 mg | ORAL_TABLET | Freq: Every day | ORAL | 1 refills | Status: DC

## 2018-03-10 NOTE — Patient Instructions (Signed)

## 2018-03-10 NOTE — Progress Notes (Signed)
Subjective:    Patient ID: Erica [name], female    DOB: March 15, 1951, 67 y.o.   MRN: [id number]   Chief Complaint: Wellness physical   HPI:  1. Annual physical exam  Will do PAP today. Had PAP 2016 with ASCUS as result.  2. Gastroesophageal reflux disease without esophagitis  Has not had in symptoms lately.   3. Osteopenia, unspecified location  Last dexascan was done 02/13/14. t score -1.5. She does exercise 3-4 times a week.  4. Hyperlipidemia with target LDL less than 100  Watches diet.  5. BMI 27.0-27.9,adult  No recent weight changes    Outpatient Encounter Medications as of 03/10/2018  Medication Sig  . aspirin 81 MG tablet Take 81 mg by mouth daily.  Marland Kitchen atorvastatin (LIPITOR) 40 MG tablet TAKE 1 TABLET BY MOUTH  DAILY  . Choline Fenofibrate (FENOFIBRIC ACID) 135 MG CPDR TAKE 1 CAPSULE BY MOUTH  DAILY  . Coenzyme Q10 (CO Q-10) 200 MG CAPS Take by mouth.  . Flaxseed, Linseed, (FLAX SEED OIL) 1000 MG CAPS Take 1 capsule by mouth daily.   Marland Kitchen KRILL OIL PO Take by mouth.  . Multiple Vitamins-Minerals (CENTRUM SILVER 50+WOMEN PO) Take 1 capsule by mouth daily.    New complaints: Left hip pain. Rates pain 1/10 now. Aleve helps it but reoccurs daily. Cannot lay on that side at night.  Social history: Retired this past July   Review of Systems  Constitutional: Negative for activity change and appetite change.  HENT: Negative.   Eyes: Negative for pain.  Respiratory: Negative for shortness of breath.   Cardiovascular: Negative for chest pain, palpitations and leg swelling.  Gastrointestinal: Negative for abdominal pain.  Endocrine: Negative for polydipsia.  Genitourinary: Negative.   Musculoskeletal: Positive for arthralgias (left hip pain).  Skin: Negative for rash.  Neurological: Negative for dizziness, weakness and headaches.  Hematological: Does not bruise/bleed easily.  Psychiatric/Behavioral: Negative.   All other systems reviewed and are negative.        Objective:   Physical Exam  Constitutional: She is oriented to person, place, and time. She appears well-developed and well-nourished. No distress.  HENT:  Head: Normocephalic.  Nose: Nose normal.  Mouth/Throat: Oropharynx is clear and moist.  Eyes: Pupils are equal, round, and reactive to light. EOM are normal.  Neck: Normal range of motion. Neck supple. No JVD present. Carotid bruit is not present.  Cardiovascular: Normal rate, regular rhythm, normal heart sounds and intact distal pulses.  Pulmonary/Chest: Effort normal and breath sounds normal. No respiratory distress. She has no wheezes. She has no rales. She exhibits no tenderness.  Abdominal: Soft. Normal appearance, normal aorta and bowel sounds are normal. She exhibits no distension, no abdominal bruit, no pulsatile midline mass and no mass. There is no splenomegaly or hepatomegaly. There is no tenderness.  Genitourinary: Vagina normal and uterus normal. No vaginal discharge found.  Genitourinary Comments: Bimanual exam normal- no adnexal masses or tenderness Cervix parous and pink  No discharge.  Musculoskeletal: Normal range of motion. She exhibits no edema.  Lymphadenopathy:    She has no cervical adenopathy.  Neurological: She is alert and oriented to person, place, and time. She has normal reflexes.  Skin: Skin is warm and dry.  Psychiatric: She has a normal mood and affect. Her behavior is normal. Judgment and thought content normal.  Nursing note and vitals reviewed.   BP (!) 143/77   Pulse 61   Temp (!) 97 F (36.1 C) (Oral)   Ht  _0  (1.626 m)   Wt 163 lb (73.9 kg)   BMI 27.98 kg/m   EKG- sinus bradycardia-Mary-Margaret Hassell Done, FNP Chest xray- no acute or chronic findings-Preliminary reading by Ronnald Collum, FNP  [facility]  Eft hip- mild osteoarthritis of left hip-Preliminary reading by Ronnald Collum, FNP  [facility]      Assessment & Plan:  JISEL FLEET comes in today with chief complaint of Wellness  physical   Diagnosis and orders addressed:  1. Annual physical exam - Pap IG (Image Guided) - CBC with Differential/Platelet - Thyroid Panel With TSH  2. Gastroesophageal reflux disease without esophagitis Avoid spicy foods Do not eat 2 hours prior to bedtime  3. Osteopenia, unspecified location Weight bearing exercises - DG [facility] DEXA  4. Hyperlipidemia with target LDL less than 100 Low fat diet - EKG 12-Lead - CMP14+EGFR - Lipid panel  5. BMI 27.0-27.9,adul Discussed diet and exercise for person with BMI >25 Will recheck weight in 3-6 months  6. Left hip pain continue aleve daily - DG HIP UNILAT W OR W/O PELVIS 2-3 VIEWS LEFT; Future  7. Hx of abnormal cervical Pap smear - Pap IG (Image Guided)  8. Hx of smoking - DG Chest 2 View; Future   Labs pending Health Maintenance reviewed Diet and exercise encouraged  Follow up plan: 6 months   Mary-Margaret Hassell Done, FNP

## 2018-03-10 NOTE — Addendum Note (Signed)
Addended by: Rolena Infante on: 03/10/2018 12:55 PM   Modules accepted: Orders

## 2018-03-11 LAB — LIPID PANEL
Chol/HDL Ratio: 2.9 ratio (ref 0.0–4.4)
Cholesterol, Total: 149 mg/dL (ref 100–199)
HDL: 51 mg/dL
LDL Calculated: 79 mg/dL (ref 0–99)
Triglycerides: 95 mg/dL (ref 0–149)
VLDL Cholesterol Cal: 19 mg/dL (ref 5–40)

## 2018-03-11 LAB — CBC WITH DIFFERENTIAL/PLATELET
BASOS: 1 %
Basophils Absolute: 0.1 10*3/uL (ref 0.0–0.2)
EOS (ABSOLUTE): 0.2 10*3/uL (ref 0.0–0.4)
Eos: 3 %
Hematocrit: 44.2 % (ref 34.0–46.6)
Hemoglobin: 14.3 g/dL (ref 11.1–15.9)
IMMATURE GRANS (ABS): 0 10*3/uL (ref 0.0–0.1)
IMMATURE GRANULOCYTES: 0 %
LYMPHS: 28 %
Lymphocytes Absolute: 2.4 10*3/uL (ref 0.7–3.1)
MCH: 30.4 pg (ref 26.6–33.0)
MCHC: 32.4 g/dL (ref 31.5–35.7)
MCV: 94 fL (ref 79–97)
Monocytes Absolute: 0.8 10*3/uL (ref 0.1–0.9)
Monocytes: 9 %
NEUTROS PCT: 59 %
Neutrophils Absolute: 5.3 10*3/uL (ref 1.4–7.0)
PLATELETS: 336 10*3/uL (ref 150–450)
RBC: 4.7 x10E6/uL (ref 3.77–5.28)
RDW: 14 % (ref 12.3–15.4)
WBC: 8.7 10*3/uL (ref 3.4–10.8)

## 2018-03-11 LAB — CMP14+EGFR
A/G RATIO: 2.2 (ref 1.2–2.2)
ALT: 38 IU/L — AB (ref 0–32)
AST: 40 IU/L (ref 0–40)
Albumin: 4.6 g/dL (ref 3.6–4.8)
Alkaline Phosphatase: 78 IU/L (ref 39–117)
BUN/Creatinine Ratio: 26 (ref 12–28)
BUN: 20 mg/dL (ref 8–27)
Bilirubin Total: 0.4 mg/dL (ref 0.0–1.2)
CALCIUM: 9.6 mg/dL (ref 8.7–10.3)
CO2: 23 mmol/L (ref 20–29)
CREATININE: 0.76 mg/dL (ref 0.57–1.00)
Chloride: 101 mmol/L (ref 96–106)
GFR calc Af Amer: 95 mL/min/{1.73_m2} (ref >59)
GFR, EST NON AFRICAN AMERICAN: 82 mL/min/{1.73_m2} (ref >59)
Globulin, Total: 2.1 g/dL (ref 1.5–4.5)
Glucose: 85 mg/dL (ref 65–99)
POTASSIUM: 4.3 mmol/L (ref 3.5–5.2)
Sodium: 141 mmol/L (ref 134–144)
Total Protein: 6.7 g/dL (ref 6.0–8.5)

## 2018-03-11 LAB — THYROID PANEL WITH TSH
Free Thyroxine Index: 2 (ref 1.2–4.9)
T3 Uptake Ratio: 29 % (ref 24–39)
T4, Total: 6.8 ug/dL (ref 4.5–12.0)
TSH: 4.47 u[IU]/mL (ref 0.450–4.500)

## 2018-03-13 ENCOUNTER — Other Ambulatory Visit: Payer: Self-pay | Admitting: Nurse Practitioner

## 2018-03-13 DIAGNOSIS — R918 Other nonspecific abnormal finding of lung field: Secondary | ICD-10-CM

## 2018-03-13 LAB — PAP IG (IMAGE GUIDED): PAP SMEAR COMMENT: 0

## 2018-03-13 LAB — HM MAMMOGRAPHY

## 2018-04-06 ENCOUNTER — Telehealth: Payer: Self-pay

## 2018-04-06 DIAGNOSIS — Z1211 Encounter for screening for malignant neoplasm of colon: Secondary | ICD-10-CM

## 2018-04-06 NOTE — Addendum Note (Signed)
Addended by: Chevis Pretty on: 04/06/2018 01:59 PM   Modules accepted: Orders

## 2018-04-06 NOTE — Telephone Encounter (Signed)
Patient wants referral for GI for colonoscopy   Dr [facility]

## 2018-04-17 ENCOUNTER — Ambulatory Visit (INDEPENDENT_AMBULATORY_CARE_PROVIDER_SITE_OTHER): Payer: BLUE CROSS/BLUE SHIELD

## 2018-04-17 DIAGNOSIS — Z23 Encounter for immunization: Secondary | ICD-10-CM | POA: Diagnosis not present

## 2018-04-21 ENCOUNTER — Telehealth: Payer: Self-pay | Admitting: Nurse Practitioner

## 2018-04-21 NOTE — Telephone Encounter (Signed)
LMOM with referral status and number for her to call their office  If she wants to

## 2018-05-20 ENCOUNTER — Other Ambulatory Visit: Payer: Self-pay | Admitting: Nurse Practitioner

## 2018-05-20 DIAGNOSIS — E785 Hyperlipidemia, unspecified: Secondary | ICD-10-CM

## 2018-07-28 ENCOUNTER — Other Ambulatory Visit: Payer: Self-pay | Admitting: Nurse Practitioner

## 2018-07-31 DIAGNOSIS — Z823 Family history of stroke: Secondary | ICD-10-CM | POA: Diagnosis not present

## 2018-07-31 DIAGNOSIS — Z833 Family history of diabetes mellitus: Secondary | ICD-10-CM | POA: Diagnosis not present

## 2018-07-31 DIAGNOSIS — R030 Elevated blood-pressure reading, without diagnosis of hypertension: Secondary | ICD-10-CM | POA: Diagnosis not present

## 2018-07-31 DIAGNOSIS — Z7722 Contact with and (suspected) exposure to environmental tobacco smoke (acute) (chronic): Secondary | ICD-10-CM | POA: Diagnosis not present

## 2018-07-31 DIAGNOSIS — Z809 Family history of malignant neoplasm, unspecified: Secondary | ICD-10-CM | POA: Diagnosis not present

## 2018-07-31 DIAGNOSIS — Z85828 Personal history of other malignant neoplasm of skin: Secondary | ICD-10-CM | POA: Diagnosis not present

## 2018-07-31 DIAGNOSIS — Z87891 Personal history of nicotine dependence: Secondary | ICD-10-CM | POA: Diagnosis not present

## 2018-07-31 DIAGNOSIS — K08409 Partial loss of teeth, unspecified cause, unspecified class: Secondary | ICD-10-CM | POA: Diagnosis not present

## 2018-07-31 DIAGNOSIS — Z8249 Family history of ischemic heart disease and other diseases of the circulatory system: Secondary | ICD-10-CM | POA: Diagnosis not present

## 2018-07-31 DIAGNOSIS — E785 Hyperlipidemia, unspecified: Secondary | ICD-10-CM | POA: Diagnosis not present

## 2018-12-14 DIAGNOSIS — R69 Illness, unspecified: Secondary | ICD-10-CM | POA: Diagnosis not present

## 2019-01-26 ENCOUNTER — Ambulatory Visit (INDEPENDENT_AMBULATORY_CARE_PROVIDER_SITE_OTHER): Payer: Medicare HMO | Admitting: Nurse Practitioner

## 2019-01-26 ENCOUNTER — Other Ambulatory Visit: Payer: Self-pay

## 2019-01-26 ENCOUNTER — Encounter: Payer: Self-pay | Admitting: Nurse Practitioner

## 2019-01-26 VITALS — BP 128/77 | HR 80 | Temp 97.5°F | Ht 64.0 in | Wt 163.0 lb

## 2019-01-26 DIAGNOSIS — Z23 Encounter for immunization: Secondary | ICD-10-CM

## 2019-01-26 DIAGNOSIS — S61211A Laceration without foreign body of left index finger without damage to nail, initial encounter: Secondary | ICD-10-CM | POA: Diagnosis not present

## 2019-01-26 NOTE — Patient Instructions (Signed)
Laceration Care, Adult A laceration is a cut that may go through all layers of the skin. The cut may also go into the tissue that is right under the skin. Some cuts heal on their own. Others need to be closed with stitches (sutures), staples, skin adhesive strips, or skin glue. Taking care of your injury lowers your risk of infection, helps your injury to heal better, and may prevent scarring. Supplies needed:  Soap.  Water.  Hand sanitizer.  Bandage (dressing).  Antibiotic ointment.  Clean towel. How to take care of your cut Wash your hands with soap and water before touching your wound or changing your bandage. If soap and water are not available, use hand sanitizer. If your doctor used stitches or staples:  Keep the wound clean and dry.  If you were given a bandage, change it at least once a day as told by your doctor. You should also change it if it gets wet or dirty.  Keep the wound completely dry for the first 24 hours, or as told by your doctor. After that, you may take a shower or a bath. Do not get the wound soaked in water until after the stitches or staples have been removed.  Clean the wound once a day, or as told by your doctor: ? Wash the wound with soap and water. ? Rinse the wound with water to remove all soap. ? Pat the wound dry with a clean towel. Do not rub the wound.  After you clean the wound, put a thin layer of antibiotic ointment on it as told by your doctor. This ointment: ? Helps to prevent infection. ? Keeps the bandage from sticking to the wound.  Have your stitches or staples removed as told by your doctor. If your doctor used skin adhesive strips:  Keep the wound clean and dry.  If you were given a bandage, you should change it at least once a day as told by your doctor. You should also change it if it gets wet or dirty.  Do not get the skin adhesive strips wet. You can take a shower or a bath, but keep the wound dry.  If the wound gets wet,  pat it dry with a clean towel. Do not rub the wound.  Skin adhesive strips fall off on their own. You can trim the strips as the wound heals. Do not remove any strips that are still stuck to the wound. They will fall off after a while. If your doctor used skin glue:  Try to keep your wound dry, but you may briefly wet it in the shower or bath. Do not soak the wound in water, such as by swimming.  After you take a shower or a bath, gently pat the wound dry with a clean towel. Do not rub the wound.  Do not do any activities that will make you really sweaty until the skin glue has fallen off on its own.  Do not apply liquid, cream, or ointment medicine to your wound while the skin glue is still on.  If you were given a bandage, you should change it at least once a day or as told by your doctor. You should also change it if it gets dirty or wet.  If a bandage is placed over the wound, do not let the tape touch the skin glue.  Do not pick at the glue. The skin glue usually stays on for 5-10 days. Then, it falls off the skin. General   instructions   Take over-the-counter and prescription medicines only as told by your doctor.  If you were given antibiotic medicine or ointment, take or apply it as told by your doctor. Do not stop using it even if your condition improves.  Do not scratch or pick at the wound.  Check your wound every day for signs of infection. Watch for: ? Redness, swelling, or pain. ? Fluid, blood, or pus.  Raise (elevate) the injured area above the level of your heart while you are sitting or lying down.  If directed, put ice on the affected area: ? Put ice in a plastic bag. ? Place a towel between your skin and the bag. ? Leave the ice on for 20 minutes, 2-3 times a day.  Prevent scarring by covering your wound with sunscreen of at least 30 SPF whenever you are outside after your wound has healed.  Keep all follow-up visits as told by your doctor. This is important.  Get help if:  You got a tetanus shot and you have any of these problems at the injection site: ? Swelling. ? Very bad pain. ? Redness. ? Bleeding.  You have a fever.  A wound that was closed breaks open.  You notice a bad smell coming from your wound or your bandage.  You notice something coming out of the wound, such as wood or glass.  Medicine does not relieve your pain.  You have more redness, swelling, or pain at the site of your wound.  You have fluid, blood, or pus coming from your wound.  You notice a change in the color of your skin near your wound.  You need to change the bandage often because fluid, blood, or pus is coming from the wound.  You start to have a new rash.  You start to have numbness around the wound. Get help right away if:  You have very bad swelling around the wound.  Your pain suddenly gets worse and is very bad.  You notice painful lumps near the wound or anywhere on your body.  You have a red streak going away from your wound.  The wound is on your hand or foot, and: ? You cannot move a finger or toe. ? Your fingers or toes look pale or bluish. Summary  A laceration is a cut that may go through all layers of the skin. The cut may also go into the tissue right under the skin.  Some cuts heal on their own. Others need to be closed with stitches, staples, skin adhesive strips, or skin glue.  Follow your doctor's instructions for caring for your cut. Proper care of a cut lowers the risk of infection, helps the cut heal better, and prevents scarring. This information is not intended to replace advice given to you by your health care provider. Make sure you discuss any questions you have with your health care provider. Document Released: 12/01/2007 Document Revised: 08/12/2017 Document Reviewed: 07/04/2017 Elsevier Patient Education  2020 [employer].

## 2019-01-26 NOTE — Progress Notes (Signed)
Subjective:    Patient ID: Erica [name], female    DOB: 11/21/1950, 68 y.o.   MRN: [id number]   Chief Complaint: Laceration   HPI Patient comes in with a cut on her index finger. She was canning spaghetti sauce and the blade from the food processor came out and sliced her finger. Sh edenies any numbness or tingling or problems with motion of finger.   Review of Systems  Constitutional: Negative for activity change and appetite change.  HENT: Negative.   Eyes: Negative for pain.  Respiratory: Negative for shortness of breath.   Cardiovascular: Negative for chest pain, palpitations and leg swelling.  Gastrointestinal: Negative for abdominal pain.  Endocrine: Negative for polydipsia.  Genitourinary: Negative.   Skin: Negative for rash.  Neurological: Negative for dizziness, weakness and headaches.  Hematological: Does not bruise/bleed easily.  Psychiatric/Behavioral: Negative.   All other systems reviewed and are negative.       Objective:   Physical Exam Vitals signs and nursing note reviewed.  Constitutional:      Appearance: Normal appearance.  Cardiovascular:     Rate and Rhythm: Regular rhythm.     Heart sounds: Normal heart sounds.  Skin:    General: Skin is warm.     Comments: 4cm l shaped laceration to left index finger  Neurological:     General: No focal deficit present.     Mental Status: She is alert and oriented to person, place, and time.    Laceration repair  Date/Time: 01/26/2019 2:44 PM Performed by: Chevis Pretty, FNP Authorized by: Chevis Pretty, FNP   Consent:    Consent obtained:  Verbal   Consent given by:  Patient   Risks discussed:  Infection and pain   Alternatives discussed:  No treatment Anesthesia (see MAR for exact dosages):    Anesthesia method:  Local infiltration   Local anesthetic:  Lidocaine 2% w/o epi Laceration details:    Location:  Finger   Finger location:  L index finger   Length (cm):  4   Depth  (mm):  2 Repair type:    Repair type:  Simple Pre-procedure details:    Preparation:  Patient was prepped and draped in usual sterile fashion Exploration:    Hemostasis achieved with:  Direct pressure   Wound exploration: wound explored through full range of motion and entire depth of wound probed and visualized     Contaminated: no   Treatment:    Area cleansed with:  Betadine   Amount of cleaning:  Extensive   Irrigation solution:  Sterile saline   Irrigation volume:  20cc   Irrigation method:  Syringe   Visualized foreign bodies/material removed: no   Skin repair:    Repair method:  Sutures   Suture size:  4-0   Suture technique:  Simple interrupted   Number of sutures:  7 Approximation:    Approximation:  Close   Vermilion border: well-aligned   Post-procedure details:    Dressing:  Tube gauze   Patient tolerance of procedure:  Tolerated well, no immediate complications   BP 264/15   Pulse 80   Temp (!) 97.5 F (36.4 C) (Oral)   Ht 5\' 4"  (1.626 m)   Wt 163 lb (73.9 kg)   BMI 27.98 kg/m        Assessment & Plan:  Erica [name] in today with chief complaint of Laceration   1. Laceration of left index finger without foreign body without damage to nail, initial  encounter Tylenol OTC for pain Elevate when sitting Leave dressing on until tomorrow Keep clean and dry Suture removal in 10 days  Mary-Margaret Hassell Done, FNP

## 2019-01-30 NOTE — Addendum Note (Signed)
Addended by: Rolena Infante on: 01/30/2019 03:11 PM   Modules accepted: Orders

## 2019-02-07 ENCOUNTER — Telehealth: Payer: Self-pay | Admitting: Nurse Practitioner

## 2019-02-07 ENCOUNTER — Other Ambulatory Visit: Payer: Self-pay

## 2019-02-07 NOTE — Telephone Encounter (Signed)
Aware.  She was advised that her grandchildren could not be with her for appointment.  She said to cancel her appointment and she would take out her own stitches.

## 2019-02-08 ENCOUNTER — Ambulatory Visit: Payer: Self-pay | Admitting: Nurse Practitioner

## 2019-03-12 ENCOUNTER — Other Ambulatory Visit: Payer: Self-pay

## 2019-03-12 DIAGNOSIS — Z85828 Personal history of other malignant neoplasm of skin: Secondary | ICD-10-CM | POA: Diagnosis not present

## 2019-03-12 DIAGNOSIS — D485 Neoplasm of uncertain behavior of skin: Secondary | ICD-10-CM | POA: Diagnosis not present

## 2019-03-12 DIAGNOSIS — L821 Other seborrheic keratosis: Secondary | ICD-10-CM | POA: Diagnosis not present

## 2019-03-12 DIAGNOSIS — L570 Actinic keratosis: Secondary | ICD-10-CM | POA: Diagnosis not present

## 2019-03-12 DIAGNOSIS — L818 Other specified disorders of pigmentation: Secondary | ICD-10-CM | POA: Diagnosis not present

## 2019-03-13 ENCOUNTER — Encounter: Payer: Self-pay | Admitting: Nurse Practitioner

## 2019-03-13 ENCOUNTER — Ambulatory Visit (INDEPENDENT_AMBULATORY_CARE_PROVIDER_SITE_OTHER): Payer: Medicare HMO | Admitting: Nurse Practitioner

## 2019-03-13 VITALS — BP 132/78 | HR 57 | Temp 96.6°F | Resp 16 | Ht 64.0 in | Wt 164.0 lb

## 2019-03-13 DIAGNOSIS — M8580 Other specified disorders of bone density and structure, unspecified site: Secondary | ICD-10-CM

## 2019-03-13 DIAGNOSIS — Z6827 Body mass index (BMI) 27.0-27.9, adult: Secondary | ICD-10-CM | POA: Diagnosis not present

## 2019-03-13 DIAGNOSIS — Z23 Encounter for immunization: Secondary | ICD-10-CM

## 2019-03-13 DIAGNOSIS — Z0001 Encounter for general adult medical examination with abnormal findings: Secondary | ICD-10-CM

## 2019-03-13 DIAGNOSIS — Z0000 Encounter for general adult medical examination without abnormal findings: Secondary | ICD-10-CM | POA: Diagnosis not present

## 2019-03-13 DIAGNOSIS — E785 Hyperlipidemia, unspecified: Secondary | ICD-10-CM | POA: Diagnosis not present

## 2019-03-13 DIAGNOSIS — K219 Gastro-esophageal reflux disease without esophagitis: Secondary | ICD-10-CM

## 2019-03-13 LAB — MICROSCOPIC EXAMINATION: Renal Epithel, UA: NONE SEEN /hpf

## 2019-03-13 LAB — URINALYSIS, COMPLETE
Bilirubin, UA: NEGATIVE
Glucose, UA: NEGATIVE
Ketones, UA: NEGATIVE
Nitrite, UA: NEGATIVE
Protein,UA: NEGATIVE
RBC, UA: NEGATIVE
Specific Gravity, UA: 1.01 (ref 1.005–1.030)
Urobilinogen, Ur: 0.2 mg/dL (ref 0.2–1.0)
pH, UA: 5 (ref 5.0–7.5)

## 2019-03-13 MED ORDER — ATORVASTATIN CALCIUM 40 MG PO TABS: 40.0000 mg | ORAL_TABLET | Freq: Every day | ORAL | 1 refills | Status: DC

## 2019-03-13 NOTE — Patient Instructions (Signed)
Exercising to Stay Healthy To become healthy and stay healthy, it is recommended that you do moderate-intensity and vigorous-intensity exercise. You can tell that you are exercising at a moderate intensity if your heart starts beating faster and you start breathing faster but can still hold a conversation. You can tell that you are exercising at a vigorous intensity if you are breathing much harder and faster and cannot hold a conversation while exercising. Exercising regularly is important. It has many health benefits, such as:  Improving overall fitness, flexibility, and endurance.  Increasing bone density.  Helping with weight control.  Decreasing body fat.  Increasing muscle strength.  Reducing stress and tension.  Improving overall health. How often should I exercise? Choose an activity that you enjoy, and set realistic goals. Your health care provider can help you make an activity plan that works for you. Exercise regularly as told by your health care provider. This may include:  Doing strength training two times a week, such as: ? Lifting weights. ? Using resistance bands. ? Push-ups. ? Sit-ups. ? Yoga.  Doing a certain intensity of exercise for a given amount of time. Choose from these options: ? A total of 150 minutes of moderate-intensity exercise every week. ? A total of 75 minutes of vigorous-intensity exercise every week. ? A mix of moderate-intensity and vigorous-intensity exercise every week. Children, pregnant women, people who have not exercised regularly, people who are overweight, and older adults may need to talk with a health care provider about what activities are safe to do. If you have a medical condition, be sure to talk with your health care provider before you start a new exercise program. What are some exercise ideas? Moderate-intensity exercise ideas include:  Walking 1 mile (1.6 km) in about 15 minutes.  Biking.  Hiking.  Golfing.  Dancing.   Water aerobics. Vigorous-intensity exercise ideas include:  Walking 4.5 miles (7.2 km) or more in about 1 hour.  Jogging or running 5 miles (8 km) in about 1 hour.  Biking 10 miles (16.1 km) or more in about 1 hour.  Lap swimming.  Roller-skating or in-line skating.  Cross-country skiing.  Vigorous competitive sports, such as football, basketball, and soccer.  Jumping rope.  Aerobic dancing. What are some everyday activities that can help me to get exercise?  Yard work, such as: ? Pushing a lawn mower. ? Raking and bagging leaves.  Washing your car.  Pushing a stroller.  Shoveling snow.  Gardening.  Washing windows or floors. How can I be more active in my day-to-day activities?  Use stairs instead of an elevator.  Take a walk during your lunch break.  If you drive, park your car farther away from your work or school.  If you take public transportation, get off one stop early and walk the rest of the way.  Stand up or walk around during all of your indoor phone calls.  Get up, stretch, and walk around every 30 minutes throughout the day.  Enjoy exercise with a friend. Support to continue exercising will help you keep a regular routine of activity. What guidelines can I follow while exercising?  Before you start a new exercise program, talk with your health care provider.  Do not exercise so much that you hurt yourself, feel dizzy, or get very short of breath.  Wear comfortable clothes and wear shoes with good support.  Drink plenty of water while you exercise to prevent dehydration or heat stroke.  Work out until your breathing   and your heartbeat get faster. Where to find more information  [place] Department of Health and Human Services: www.hhs.gov  Centers for Disease Control and Prevention (CDC): www.cdc.gov Summary  Exercising regularly is important. It will improve your overall fitness, flexibility, and endurance.  Regular exercise also will  improve your overall health. It can help you control your weight, reduce stress, and improve your bone density.  Do not exercise so much that you hurt yourself, feel dizzy, or get very short of breath.  Before you start a new exercise program, talk with your health care provider. This information is not intended to replace advice given to you by your health care provider. Make sure you discuss any questions you have with your health care provider. Document Released: 07/17/2010 Document Revised: 05/27/2017 Document Reviewed: 05/05/2017 Elsevier Patient Education  2020 Else[employer] Inc.  

## 2019-03-13 NOTE — Addendum Note (Signed)
Addended by: Rolena Infante on: 03/13/2019 05:03 PM   Modules accepted: Orders

## 2019-03-13 NOTE — Progress Notes (Signed)
Subjective:    Patient ID: Erica [name], female    DOB: [birth year], 68 y.o.   MRN: [id number]   Chief Complaint: Medical Management of Chronic Issues    HPI:  1. Annual physical exam No pap today  2. Hyperlipidemia with target LDL less than 100 Watches diet and does exercise 3-4 x a week. Lab Results  Component Value Date   CHOL 149 03/10/2018   HDL 51 03/10/2018   LDLCALC 79 03/10/2018   TRIG 95 03/10/2018   CHOLHDL 2.9 03/10/2018     3. Gastroesophageal reflux disease without esophagitis Is doing well watching diet.   4. Osteopenia, unspecified location Last dexascan was done on 03/10/18 with t score of -1.8. she does weight bearing exercise.  5. BMI 27.0-27.9,adult No recent weight changes Wt Readings from Last 3 Encounters:  03/13/19 164 lb (74.4 kg)  01/26/19 163 lb (73.9 kg)  03/10/18 163 lb (73.9 kg)   BMI Readings from Last 3 Encounters:  03/13/19 28.15 kg/m  01/26/19 27.98 kg/m  03/10/18 27.98 kg/m       Outpatient Encounter Medications as of 03/13/2019  Medication Sig  . atorvastatin (LIPITOR) 40 MG tablet TAKE 1 TABLET BY MOUTH  DAILY  . cholecalciferol (VITAMIN D3) 25 MCG (1000 UT) tablet Take 1,000 Units by mouth daily.  . Coenzyme Q10 (CO Q-10) 200 MG CAPS Take by mouth.  Marland Kitchen KRILL OIL PO Take by mouth.  . Multiple Vitamins-Minerals (CENTRUM SILVER 50+WOMEN PO) Take 1 capsule by mouth daily.  . Choline Fenofibrate (FENOFIBRIC ACID) 135 MG CPDR TAKE 1 CAPSULE BY MOUTH  DAILY (Patient not taking: Reported on 01/26/2019)    Past Surgical History:  Procedure Laterality Date  . CHOLECYSTECTOMY N/A 05/14/2014   Procedure: LAPAROSCOPIC CHOLECYSTECTOMY WITH INTRAOPERATIVE CHOLANGIOGRAM;  Surgeon: Gayland Curry, MD;  Location: [place];  Service: General;  Laterality: N/A;  . NECK SURGERY    . TONSILECTOMY/ADENOIDECTOMY WITH MYRINGOTOMY    . TUBAL LIGATION      Family History  Problem Relation Age of Onset  . Heart disease Father   . Stroke  Sister   . Osteoporosis Mother   . Heart attack Brother     New complaints: None today  Social history: Is retired and loving it  Controlled substance contract: n/a    Review of Systems  Constitutional: Negative for activity change and appetite change.  HENT: Negative.   Eyes: Negative for pain.  Respiratory: Negative for shortness of breath.   Cardiovascular: Negative for chest pain, palpitations and leg swelling.  Gastrointestinal: Negative for abdominal pain.  Endocrine: Negative for polydipsia.  Genitourinary: Negative.   Skin: Negative for rash.  Neurological: Negative for dizziness, weakness and headaches.  Hematological: Does not bruise/bleed easily.  Psychiatric/Behavioral: Negative.   All other systems reviewed and are negative.      Objective:   Physical Exam Vitals signs and nursing note reviewed.  Constitutional:      General: She is not in acute distress.    Appearance: Normal appearance. She is well-developed.  HENT:     Head: Normocephalic.     Nose: Nose normal.  Eyes:     Pupils: Pupils are equal, round, and reactive to light.  Neck:     Musculoskeletal: Normal range of motion and neck supple.     Vascular: No carotid bruit or JVD.  Cardiovascular:     Rate and Rhythm: Normal rate and regular rhythm.     Heart sounds: Normal heart sounds.  Pulmonary:  Effort: Pulmonary effort is normal. No respiratory distress.     Breath sounds: Normal breath sounds. No wheezing or rales.  Chest:     Chest wall: No tenderness.  Abdominal:     General: Bowel sounds are normal. There is no distension or abdominal bruit.     Palpations: Abdomen is soft. There is no hepatomegaly, splenomegaly, mass or pulsatile mass.     Tenderness: There is no abdominal tenderness.  Musculoskeletal: Normal range of motion.  Lymphadenopathy:     Cervical: No cervical adenopathy.  Skin:    General: Skin is warm and dry.  Neurological:     Mental Status: She is alert and  oriented to person, place, and time.     Deep Tendon Reflexes: Reflexes are normal and symmetric.  Psychiatric:        Behavior: Behavior normal.        Thought Content: Thought content normal.        Judgment: Judgment normal.    BP 132/78 (Cuff Size: Normal)   Pulse (!) 57   Temp (!) 96.6 F (35.9 C) (Oral)   Resp 16   Ht 5' 4" (1.626 m)   Wt 164 lb (74.4 kg)   SpO2 98%   BMI 28.15 kg/m          Assessment & Plan:  Erica [name] comes in today with chief complaint of Medical Management of Chronic Issues   Diagnosis and orders addressed:  1. Annual physical exam - Urinalysis, Complete - CBC with Differential/Platelet - Thyroid Panel With TSH  2. Hyperlipidemia with target LDL less than 100 Low fat diet - CMP14+EGFR - Lipid panel  3. Gastroesophageal reflux disease without esophagitis Avoid spicy foods Do not eat 2 hours prior to bedtime  4. Osteopenia, unspecified location Weight bearing exercise  5. BMI 27.0-27.9,adult Discussed diet and exercise for person with BMI >25 Will recheck weight in 3-6 months    Labs pending Health Maintenance reviewed Diet and exercise encouraged  Follow up plan: 6 months   Mary-Margaret Hassell Done, FNP

## 2019-03-14 LAB — CMP14+EGFR
ALT: 34 IU/L — ABNORMAL HIGH (ref 0–32)
AST: 38 IU/L (ref 0–40)
Albumin/Globulin Ratio: 2 (ref 1.2–2.2)
Albumin: 4.7 g/dL (ref 3.8–4.8)
Alkaline Phosphatase: 106 IU/L (ref 39–117)
BUN/Creatinine Ratio: 16 (ref 12–28)
BUN: 14 mg/dL (ref 8–27)
Bilirubin Total: 0.3 mg/dL (ref 0.0–1.2)
CO2: 24 mmol/L (ref 20–29)
Calcium: 9.5 mg/dL (ref 8.7–10.3)
Chloride: 100 mmol/L (ref 96–106)
Creatinine, Ser: 0.88 mg/dL (ref 0.57–1.00)
GFR calc Af Amer: 79 mL/min/{1.73_m2} (ref >59)
GFR calc non Af Amer: 68 mL/min/{1.73_m2} (ref >59)
Globulin, Total: 2.3 g/dL (ref 1.5–4.5)
Glucose: 91 mg/dL (ref 65–99)
Potassium: 4.2 mmol/L (ref 3.5–5.2)
Sodium: 139 mmol/L (ref 134–144)
Total Protein: 7 g/dL (ref 6.0–8.5)

## 2019-03-14 LAB — CBC WITH DIFFERENTIAL/PLATELET
Basophils Absolute: 0.1 10*3/uL (ref 0.0–0.2)
Basos: 1 %
EOS (ABSOLUTE): 0.1 10*3/uL (ref 0.0–0.4)
Eos: 2 %
Hematocrit: 44 % (ref 34.0–46.6)
Hemoglobin: 15.3 g/dL (ref 11.1–15.9)
Immature Grans (Abs): 0 10*3/uL (ref 0.0–0.1)
Immature Granulocytes: 0 %
Lymphocytes Absolute: 3 10*3/uL (ref 0.7–3.1)
Lymphs: 33 %
MCH: 31.6 pg (ref 26.6–33.0)
MCHC: 34.8 g/dL (ref 31.5–35.7)
MCV: 91 fL (ref 79–97)
Monocytes Absolute: 0.8 10*3/uL (ref 0.1–0.9)
Monocytes: 8 %
Neutrophils Absolute: 5.2 10*3/uL (ref 1.4–7.0)
Neutrophils: 56 %
Platelets: 281 10*3/uL (ref 150–450)
RBC: 4.84 x10E6/uL (ref 3.77–5.28)
RDW: 12.8 % (ref 11.7–15.4)
WBC: 9.2 10*3/uL (ref 3.4–10.8)

## 2019-03-14 LAB — LIPID PANEL
Chol/HDL Ratio: 3 ratio (ref 0.0–4.4)
Cholesterol, Total: 169 mg/dL (ref 100–199)
HDL: 56 mg/dL (ref >39)
LDL Chol Calc (NIH): 92 mg/dL (ref 0–99)
Triglycerides: 121 mg/dL (ref 0–149)
VLDL Cholesterol Cal: 21 mg/dL (ref 5–40)

## 2019-03-14 LAB — THYROID PANEL WITH TSH
Free Thyroxine Index: 1.7 (ref 1.2–4.9)
T3 Uptake Ratio: 26 % (ref 24–39)
T4, Total: 6.5 ug/dL (ref 4.5–12.0)
TSH: 4.92 u[IU]/mL — ABNORMAL HIGH (ref 0.450–4.500)

## 2019-06-13 ENCOUNTER — Encounter: Payer: Self-pay | Admitting: Family Medicine

## 2019-06-13 ENCOUNTER — Ambulatory Visit (INDEPENDENT_AMBULATORY_CARE_PROVIDER_SITE_OTHER): Payer: Medicare HMO | Admitting: Family Medicine

## 2019-06-13 DIAGNOSIS — J0100 Acute maxillary sinusitis, unspecified: Secondary | ICD-10-CM | POA: Diagnosis not present

## 2019-06-13 MED ORDER — AMOXICILLIN-POT CLAVULANATE 875-125 MG PO TABS: 1.0000 | ORAL_TABLET | Freq: Two times a day (BID) | ORAL | 0 refills | Status: DC

## 2019-06-13 NOTE — Progress Notes (Signed)
Subjective:    Patient ID: Erica [name], female    DOB: 1951/03/09, 68 y.o.   MRN: [id number]   HPI: Erica [name] is a 68 y.o. female presenting for for th.ree weeks awakening with dry mouth. Sore throat at upper back of mouth. Humidifier and drinking more water helped. Had LGF 4 days ago. Went to 100.6. Pressure in her head as well. Fever recurring each evening, low grade - around 99.4. Occasional cough and posterior drainage. Not dyspneic. Brings up a little phlegm in the morning. No loss of taste or smell.   Depression screen [facility] 2/9 03/13/2019 01/26/2019 03/10/2018 12/19/2017 06/09/2017  Decreased Interest 0 0 0 0 0  Down, Depressed, Hopeless 0 0 0 0 0  PHQ - 2 Score 0 0 0 0 0     Relevant past medical, surgical, family and social history reviewed and updated as indicated.  Interim medical history since our last visit reviewed. Allergies and medications reviewed and updated.  ROS:  Review of Systems  Constitutional: Negative for activity change, appetite change, chills and fever.  HENT: Positive for congestion, postnasal drip and sinus pressure. Negative for ear pain, hearing loss, nosebleeds, rhinorrhea and sneezing.   Respiratory: Positive for cough (minimal). Negative for chest tightness and shortness of breath.   Cardiovascular: Negative for chest pain.  Skin: Negative for rash.     Social History   Tobacco Use  Smoking Status Former Smoker  . Packs/day: 1.00  . Years: 40.00  . Pack years: 40.00  . Types: Cigarettes  . Quit date: 06/29/2007  . Years since quitting: 11.9  Smokeless Tobacco Never Used       Objective:     Wt Readings from Last 3 Encounters:  03/13/19 164 lb (74.4 kg)  01/26/19 163 lb (73.9 kg)  03/10/18 163 lb (73.9 kg)     Exam deferred. Pt. Harboring due to COVID 19. Phone visit performed.   Assessment & Plan:   1. Acute maxillary sinusitis, recurrence not specified     Meds ordered this encounter  Medications  .  amoxicillin-clavulanate (AUGMENTIN) 875-125 MG tablet    Sig: Take 1 tablet by mouth 2 (two) times daily. Take all of this medication    Dispense:  20 tablet    Refill:  0    Consider Mucinex D    Diagnoses and all orders for this visit:  Acute maxillary sinusitis, recurrence not specified  Other orders -     amoxicillin-clavulanate (AUGMENTIN) 875-125 MG tablet; Take 1 tablet by mouth 2 (two) times daily. Take all of this medication    Virtual Visit via telephone Note  I discussed the limitations, risks, security and privacy concerns of performing an evaluation and management service by telephone and the availability of in person appointments. The patient was identified with two identifiers. Pt.expressed understanding and agreed to proceed. Pt. Is at home. Dr. Livia Snellen is in his office.  Follow Up Instructions:   I discussed the assessment and treatment plan with the patient. The patient was provided an opportunity to ask questions and all were answered. The patient agreed with the plan and demonstrated an understanding of the instructions.   The patient was advised to call back or seek an in-person evaluation if the symptoms worsen or if the condition fails to improve as anticipated.   Total minutes including chart review and phone contact time: 12   Follow up plan: Return if symptoms worsen or fail to improve.  Claretta Fraise, MD  [place]

## 2019-06-17 DIAGNOSIS — R509 Fever, unspecified: Secondary | ICD-10-CM | POA: Diagnosis not present

## 2019-06-17 DIAGNOSIS — Z20828 Contact with and (suspected) exposure to other viral communicable diseases: Secondary | ICD-10-CM | POA: Diagnosis not present

## 2019-07-02 DIAGNOSIS — R69 Illness, unspecified: Secondary | ICD-10-CM | POA: Diagnosis not present

## 2019-09-07 ENCOUNTER — Other Ambulatory Visit: Payer: Self-pay

## 2019-09-10 ENCOUNTER — Encounter: Payer: Self-pay | Admitting: Nurse Practitioner

## 2019-09-10 ENCOUNTER — Ambulatory Visit (INDEPENDENT_AMBULATORY_CARE_PROVIDER_SITE_OTHER): Payer: Medicare HMO | Admitting: Nurse Practitioner

## 2019-09-10 ENCOUNTER — Other Ambulatory Visit: Payer: Self-pay

## 2019-09-10 VITALS — BP 135/70 | HR 67 | Temp 95.0°F | Ht 64.0 in | Wt 168.0 lb

## 2019-09-10 DIAGNOSIS — K219 Gastro-esophageal reflux disease without esophagitis: Secondary | ICD-10-CM | POA: Diagnosis not present

## 2019-09-10 DIAGNOSIS — Z6827 Body mass index (BMI) 27.0-27.9, adult: Secondary | ICD-10-CM

## 2019-09-10 DIAGNOSIS — M8580 Other specified disorders of bone density and structure, unspecified site: Secondary | ICD-10-CM

## 2019-09-10 DIAGNOSIS — E785 Hyperlipidemia, unspecified: Secondary | ICD-10-CM

## 2019-09-10 MED ORDER — ATORVASTATIN CALCIUM 40 MG PO TABS: 40.0000 mg | ORAL_TABLET | Freq: Every day | ORAL | 1 refills | Status: DC

## 2019-09-10 MED ORDER — FENOFIBRIC ACID 135 MG PO CPDR: 1.0000 | DELAYED_RELEASE_CAPSULE | Freq: Every day | ORAL | 0 refills | Status: DC

## 2019-09-10 NOTE — Addendum Note (Signed)
Addended by: Chevis Pretty on: 09/10/2019 04:07 PM   Modules accepted: Orders

## 2019-09-10 NOTE — Progress Notes (Signed)
Subjective:    Patient ID: Erica [name], female    DOB: 10/31/1950, 69 y.o.   MRN: [id number]   Chief Complaint: Medical Management of Chronic Issues    HPI:  1. Hyperlipidemia with target LDL less than 100 Not watching diet and doing very little exercise. Lab Results  Component Value Date   CHOL 169 03/13/2019   HDL 56 03/13/2019   LDLCALC 92 03/13/2019   TRIG 121 03/13/2019   CHOLHDL 3.0 03/13/2019     2. Gastroesophageal reflux disease without esophagitis Has not had any problems. Uses OTC meds when needed.  3. Osteopenia, unspecified location Had dexascan on 03/10/18 with t score of -1.8. she takes calcium and vitamin d supplement.  4. BMI 27.0-27.9,adult Weight is up 4 lbs Wt Readings from Last 3 Encounters:  09/10/19 168 lb (76.2 kg)  03/13/19 164 lb (74.4 kg)  01/26/19 163 lb (73.9 kg)   BMI Readings from Last 3 Encounters:  09/10/19 28.84 kg/m  03/13/19 28.15 kg/m  01/26/19 27.98 kg/m       Outpatient Encounter Medications as of 09/10/2019  Medication Sig  . atorvastatin (LIPITOR) 40 MG tablet Take 1 tablet (40 mg total) by mouth daily.  . cholecalciferol (VITAMIN D3) 25 MCG (1000 UT) tablet Take 1,000 Units by mouth daily.  . Choline Fenofibrate (FENOFIBRIC ACID) 135 MG CPDR TAKE 1 CAPSULE BY MOUTH  DAILY (Patient not taking: Reported on 01/26/2019)  . Coenzyme Q10 (CO Q-10) 200 MG CAPS Take by mouth.  Marland Kitchen KRILL OIL PO Take by mouth.  . Multiple Vitamins-Minerals (CENTRUM SILVER 50+WOMEN PO) Take 1 capsule by mouth daily.     Past Surgical History:  Procedure Laterality Date  . CHOLECYSTECTOMY N/A 05/14/2014   Procedure: LAPAROSCOPIC CHOLECYSTECTOMY WITH INTRAOPERATIVE CHOLANGIOGRAM;  Surgeon: Gayland Curry, MD;  Location: [place];  Service: General;  Laterality: N/A;  . NECK SURGERY    . TONSILECTOMY/ADENOIDECTOMY WITH MYRINGOTOMY    . TUBAL LIGATION    /  Family History  Problem Relation Age of Onset  . Heart disease Father   . Stroke  Sister   . Osteoporosis Mother   . Heart attack Brother     New complaints: none today  Social history: Lives by herself  Controlled substance contract: n/a    Review of Systems  Constitutional: Negative for diaphoresis.  Eyes: Negative for pain.  Respiratory: Negative for shortness of breath.   Cardiovascular: Negative for chest pain, palpitations and leg swelling.  Gastrointestinal: Negative for abdominal pain.  Endocrine: Negative for polydipsia.  Skin: Negative for rash.  Neurological: Negative for dizziness, weakness and headaches.  Hematological: Does not bruise/bleed easily.  All other systems reviewed and are negative.      Objective:   Physical Exam Vitals and nursing note reviewed.  Constitutional:      General: She is not in acute distress.    Appearance: Normal appearance. She is well-developed.  HENT:     Head: Normocephalic.     Nose: Nose normal.  Eyes:     Pupils: Pupils are equal, round, and reactive to light.  Neck:     Vascular: No carotid bruit or JVD.  Cardiovascular:     Rate and Rhythm: Normal rate and regular rhythm.     Heart sounds: Normal heart sounds.  Pulmonary:     Effort: Pulmonary effort is normal. No respiratory distress.     Breath sounds: Normal breath sounds. No wheezing or rales.  Chest:     Chest  wall: No tenderness.  Abdominal:     General: Bowel sounds are normal. There is no distension or abdominal bruit.     Palpations: Abdomen is soft. There is no hepatomegaly, splenomegaly, mass or pulsatile mass.     Tenderness: There is no abdominal tenderness.  Musculoskeletal:        General: Normal range of motion.     Cervical back: Normal range of motion and neck supple.  Lymphadenopathy:     Cervical: No cervical adenopathy.  Skin:    General: Skin is warm and dry.  Neurological:     Mental Status: She is alert and oriented to person, place, and time.     Deep Tendon Reflexes: Reflexes are normal and symmetric.   Psychiatric:        Behavior: Behavior normal.        Thought Content: Thought content normal.        Judgment: Judgment normal.    BP 135/70   Pulse 67   Temp (!) 95 F (35 C) (Temporal)   Ht 5' 4" (1.626 m)   Wt 168 lb (76.2 kg)   SpO2 94%   BMI 28.84 kg/m        Assessment & Plan:  URI COVEY comes in today with chief complaint of Medical Management of Chronic Issues   Diagnosis and orders addressed:  1. Hyperlipidemia with target LDL less than 100 Low fat diet - CMP14+EGFR - Lipid panel - CBC with Differential/Platelet - Choline Fenofibrate (FENOFIBRIC ACID) 135 MG CPDR; Take 1 capsule by mouth daily.  Dispense: 90 capsule; Refill: 0 - atorvastatin (LIPITOR) 40 MG tablet; Take 1 tablet (40 mg total) by mouth daily.  Dispense: 90 tablet; Refill: 1  2. Gastroesophageal reflux disease without esophagitis Avoid spicy foods Do not eat 2 hours prior to bedtime   3. Osteopenia, unspecified location Weight bearing exercise encouraged  4. BMI 27.0-27.9,adult Discussed diet and exercise for person with BMI >25 Will recheck weight in 3-6 months    Labs pending Health Maintenance reviewed Diet and exercise encouraged  Follow up plan: 6 months   Mary-Margaret Hassell Done, FNP

## 2019-09-10 NOTE — Addendum Note (Signed)
Addended by: Brynda Peon F on: 09/10/2019 04:07 PM   Modules accepted: Orders

## 2019-09-10 NOTE — Patient Instructions (Signed)

## 2019-09-11 LAB — CMP14+EGFR
ALT: 34 IU/L — ABNORMAL HIGH (ref 0–32)
AST: 37 IU/L (ref 0–40)
Albumin/Globulin Ratio: 1.7 (ref 1.2–2.2)
Albumin: 4.4 g/dL (ref 3.8–4.8)
Alkaline Phosphatase: 110 IU/L (ref 39–117)
BUN/Creatinine Ratio: 16 (ref 12–28)
BUN: 12 mg/dL (ref 8–27)
Bilirubin Total: 0.3 mg/dL (ref 0.0–1.2)
CO2: 25 mmol/L (ref 20–29)
Calcium: 9.3 mg/dL (ref 8.7–10.3)
Chloride: 101 mmol/L (ref 96–106)
Creatinine, Ser: 0.76 mg/dL (ref 0.57–1.00)
GFR calc Af Amer: 93 mL/min/{1.73_m2} (ref >59)
GFR calc non Af Amer: 81 mL/min/{1.73_m2} (ref >59)
Globulin, Total: 2.6 g/dL (ref 1.5–4.5)
Glucose: 99 mg/dL (ref 65–99)
Potassium: 4.2 mmol/L (ref 3.5–5.2)
Sodium: 139 mmol/L (ref 134–144)
Total Protein: 7 g/dL (ref 6.0–8.5)

## 2019-09-11 LAB — CBC WITH DIFFERENTIAL/PLATELET
Basophils Absolute: 0.1 10*3/uL (ref 0.0–0.2)
Basos: 1 %
EOS (ABSOLUTE): 0.2 10*3/uL (ref 0.0–0.4)
Eos: 3 %
Hematocrit: 45 % (ref 34.0–46.6)
Hemoglobin: 15.2 g/dL (ref 11.1–15.9)
Immature Grans (Abs): 0 10*3/uL (ref 0.0–0.1)
Immature Granulocytes: 0 %
Lymphocytes Absolute: 2.8 10*3/uL (ref 0.7–3.1)
Lymphs: 33 %
MCH: 30.3 pg (ref 26.6–33.0)
MCHC: 33.8 g/dL (ref 31.5–35.7)
MCV: 90 fL (ref 79–97)
Monocytes Absolute: 0.8 10*3/uL (ref 0.1–0.9)
Monocytes: 9 %
Neutrophils Absolute: 4.6 10*3/uL (ref 1.4–7.0)
Neutrophils: 54 %
Platelets: 334 10*3/uL (ref 150–450)
RBC: 5.01 x10E6/uL (ref 3.77–5.28)
RDW: 14.4 % (ref 11.7–15.4)
WBC: 8.5 10*3/uL (ref 3.4–10.8)

## 2019-09-11 LAB — LIPID PANEL
Chol/HDL Ratio: 3.3 ratio (ref 0.0–4.4)
Cholesterol, Total: 164 mg/dL (ref 100–199)
HDL: 50 mg/dL (ref >39)
LDL Chol Calc (NIH): 90 mg/dL (ref 0–99)
Triglycerides: 139 mg/dL (ref 0–149)
VLDL Cholesterol Cal: 24 mg/dL (ref 5–40)

## 2019-09-25 ENCOUNTER — Ambulatory Visit: Payer: Medicare HMO

## 2019-09-26 DIAGNOSIS — L570 Actinic keratosis: Secondary | ICD-10-CM | POA: Diagnosis not present

## 2019-09-26 DIAGNOSIS — Z85828 Personal history of other malignant neoplasm of skin: Secondary | ICD-10-CM | POA: Diagnosis not present

## 2019-09-27 ENCOUNTER — Ambulatory Visit (INDEPENDENT_AMBULATORY_CARE_PROVIDER_SITE_OTHER): Payer: Medicare HMO | Admitting: *Deleted

## 2019-09-27 DIAGNOSIS — Z0000 Encounter for general adult medical examination without abnormal findings: Secondary | ICD-10-CM

## 2019-09-27 NOTE — Progress Notes (Addendum)
MEDICARE ANNUAL WELLNESS VISIT  09/27/2019  Telephone Visit Disclaimer This Medicare AWV was conducted by telephone due to national recommendations for restrictions regarding the COVID-19 Pandemic (e.g. social distancing).  I verified, using two identifiers, that I am speaking with Erica [name] or their authorized healthcare agent. I discussed the limitations, risks, security, and privacy concerns of performing an evaluation and management service by telephone and the potential availability of an in-person appointment in the future. The patient expressed understanding and agreed to proceed.   Subjective:  Erica [name] is a 69 y.o. 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 She is divorced and lives alone. She has 2 daughters and 1 son. She reports that she is socially active and does interact with friends/family regularly. She is minimally physically active and enjoys gardening and golf.  Patient Care Team: Erica Pretty, FNP as PCP - General (Nurse Practitioner) Erica Craze, MD as Referring Physician (Dermatology)  Advanced Directives 09/27/2019 12/20/2017 05/09/2014  Does Patient Have a Medical Advance Directive? Yes Yes No  Type of Paramedic of [place];Living will;Out of facility DNR (pink MOST or yellow form) - -  Would patient like information on creating a medical advance directive? - - Yes - Educational materials given    Hospital Utilization Over the Past 12 Months: # of hospitalizations or ER visits: 0 # of surgeries: 0  Review of Systems    Patient reports that her overall health is unchanged compared to last year.  History obtained from the patient.  Patient Reported Readings (BP, Pulse, CBG, Weight, etc) none  Pain Assessment Pain : No/denies pain     Current Medications & Allergies (verified) Allergies as of 09/27/2019    Reactions   Povidone-iodine Rash   Experienced are betadine was left on an area for and extended period.   Statins    myalgias      Medication List       Accurate as of September 27, 2019  8:49 AM. If you have any questions, ask your nurse or doctor.        STOP taking these medications   Fenofibric Acid 135 MG Cpdr     TAKE these medications   atorvastatin 40 MG tablet Commonly known as: LIPITOR Take 1 tablet (40 mg total) by mouth daily.   CENTRUM SILVER 50+WOMEN PO Take 1 capsule by mouth daily.   cholecalciferol 25 MCG (1000 UNIT) tablet Commonly known as: VITAMIN D3 Take 1,000 Units by mouth daily.   Co Q-10 200 MG Caps Take by mouth.   KRILL OIL PO Take by mouth.       History (reviewed): Past Medical History:  Diagnosis Date  . Complication of anesthesia   . Hyperlipidemia   . Osteopenia    Past Surgical History:  Procedure Laterality Date  . CHOLECYSTECTOMY N/A 05/14/2014   Procedure: LAPAROSCOPIC CHOLECYSTECTOMY WITH INTRAOPERATIVE CHOLANGIOGRAM;  Surgeon: Gayland Curry, MD;  Location: [facility];  Service: General;  Laterality: N/A;  . NECK SURGERY    . TONSILECTOMY/ADENOIDECTOMY WITH MYRINGOTOMY    . TUBAL LIGATION     Family History  Problem Relation Age of Onset  . Heart disease Father   . Stroke Sister   . Osteoporosis Mother   . Heart attack Brother   . Arthritis Daughter    Social History   Socioeconomic History  . Marital status: Divorced    Spouse  name: Not on file  . Number of children: 3  . Years of education: ged  . Highest education level: GED or equivalent  Occupational History  . Occupation: Charity fundraiser    Comment: retired  Tobacco Use  . Smoking status: Former Smoker    Packs/day: 1.00    Years: 40.00    Pack years: 40.00    Types: Cigarettes    Quit date: 06/29/2007    Years since quitting: 12.2  . Smokeless tobacco: Never Used  Substance and Sexual Activity  . Alcohol use: Yes    Comment: rarely  . Drug use: No  .  Sexual activity: Not on file  Other Topics Concern  . Not on file  Social History Narrative  . Not on file   Social Determinants of Health   Financial Resource Strain:   . Difficulty of Paying Living Expenses:   Food Insecurity:   . Worried About Charity fundraiser in the Last Year:   . Arboriculturist in the Last Year:   Transportation Needs:   . Film/video editor (Medical):   Marland Kitchen Lack of Transportation (Non-Medical):   Physical Activity:   . Days of Exercise per Week:   . Minutes of Exercise per Session:   Stress:   . Feeling of Stress :   Social Connections:   . Frequency of Communication with Friends and Family:   . Frequency of Social Gatherings with Friends and Family:   . Attends Religious Services:   . Active Member of Clubs or Organizations:   . Attends Archivist Meetings:   Marland Kitchen Marital Status:     Activities of Daily Living In your present state of health, do you have any difficulty performing the following activities: 09/27/2019  Hearing? N  Vision? N  Difficulty concentrating or making decisions? N  Walking or climbing stairs? N  Dressing or bathing? N  Doing errands, shopping? N  Preparing Food and eating ? N  Using the Toilet? N  In the past six months, have you accidently leaked urine? N  Do you have problems with loss of bowel control? N  Managing your Medications? N  Managing your Finances? N  Housekeeping or managing your Housekeeping? N  Some recent data might be hidden    Patient Education/ Literacy How often do you need to have someone help you when you read instructions, pamphlets, or other written materials from your doctor or pharmacy?: 1 - Never What is the last grade level you completed in school?: GED  Exercise Current Exercise Habits: The patient does not participate in regular exercise at present, Exercise limited by: None identified  Diet Patient reports consuming 3 meals a day and 2 snack(s) a day Patient reports that  her primary diet is: Regular Patient reports that she does have regular access to food.   Depression Screen PHQ 2/9 Scores 09/27/2019 09/10/2019 03/13/2019 01/26/2019 03/10/2018 12/19/2017 06/09/2017  PHQ - 2 Score 0 0 0 0 0 0 0     Fall Risk Fall Risk  09/27/2019 09/10/2019 03/13/2019 01/26/2019 03/10/2018  Falls in the past year? 0 0 0 0 No  Number falls in past yr: - - - - -  Injury with Fall? - - - - -     Objective:  Erica [name] seemed alert and oriented and she participated appropriately during our telephone visit.  Blood Pressure Weight BMI  BP Readings from Last 3 Encounters:  09/10/19 135/70  03/13/19 132/78  01/26/19  128/77   Wt Readings from Last 3 Encounters:  09/10/19 168 lb (76.2 kg)  03/13/19 164 lb (74.4 kg)  01/26/19 163 lb (73.9 kg)   BMI Readings from Last 1 Encounters:  09/10/19 28.84 kg/m    *Unable to obtain current vital signs, weight, and BMI due to telephone visit type  Hearing/Vision  . Laci did not seem to have difficulty with hearing/understanding during the telephone conversation . Reports that she has not had a formal eye exam by an eye care professional within the past year . Reports that she has not had a formal hearing evaluation within the past year *Unable to fully assess hearing and vision during telephone visit type  Cognitive Function: 6CIT Screen 09/27/2019  What Year? 0 points  What month? 0 points  What time? 0 points  Count back from 20 0 points  Months in reverse 0 points  Repeat phrase 0 points  Total Score 0   (Normal:0-7, Significant for Dysfunction: >8)  Normal Cognitive Function Screening: Yes   Immunization & Health Maintenance Record Immunization History  Administered Date(s) Administered  . Fluad Quad(high Dose 65+) 03/13/2019  . Influenza Split 04/17/2013  . Influenza, High Dose Seasonal PF 04/12/2017, 04/17/2018  . Influenza-Unspecified 04/16/2014, 04/08/2015, 04/06/2016  . Moderna SARS-COVID-2 Vaccination  08/03/2019, 08/31/2019  . Pneumococcal Conjugate-13 03/10/2018  . Pneumococcal Polysaccharide-23 03/13/2019  . Tdap 01/26/2019  . Zoster 04/21/2011    Health Maintenance  Topic Date Due  . INFLUENZA VACCINE  01/27/2020  . MAMMOGRAM  03/13/2020  . COLONOSCOPY  05/16/2028  . TETANUS/TDAP  01/25/2029  . DEXA SCAN  Completed  . Hepatitis C Screening  Completed  . PNA vac Low Risk Adult  Completed       Assessment  This is a routine wellness examination for Erica [name].  Health Maintenance: Due or Overdue There are no preventive care reminders to display for this patient.  Erica [name] does not need a referral for Community Assistance: Care Management:   no Social Work:    no Prescription Assistance:  no Nutrition/Diabetes Education:  no   Plan:  Personalized Goals Goals Addressed            This Visit's Progress   . Patient Stated       09/27/2019 AWV Goal: Exercise for General Health   Patient will verbalize understanding of the benefits of increased physical activity:  Exercising regularly is important. It will improve your overall fitness, flexibility, and endurance.  Regular exercise also will improve your overall health. It can help you control your weight, reduce stress, and improve your bone density.  Over the next year, patient will increase physical activity as tolerated with a goal of at least 150 minutes of moderate physical activity per week.   You can tell that you are exercising at a moderate intensity if your heart starts beating faster and you start breathing faster but can still hold a conversation.  Moderate-intensity exercise ideas include:  Walking 1 mile (1.6 km) in about 15 minutes  Biking  Hiking  Golfing  Dancing  Water aerobics  Patient will verbalize understanding of everyday activities that increase physical activity by providing examples like the following: ? Yard work, such as: ? Pushing a Conservation officer, nature ? Raking and  bagging leaves ? Washing your car ? Pushing a stroller ? Shoveling snow ? Gardening ? Washing windows or floors  Patient will be able to explain general safety guidelines for exercising:   Before you start  a new exercise program, talk with your health care provider.  Do not exercise so much that you hurt yourself, feel dizzy, or get very short of breath.  Wear comfortable clothes and wear shoes with good support.  Drink plenty of water while you exercise to prevent dehydration or heat stroke.  Work out until your breathing and your heartbeat get faster.       Personalized Health Maintenance & Screening Recommendations  Eye Exam  Lung Cancer Screening Recommended: no (Low Dose CT Chest recommended if Age 53-80 years, 30 pack-year currently smoking OR have quit w/in past 15 years) Hepatitis C Screening recommended: no HIV Screening recommended: no  Advanced Directives: Written information was not prepared per patient's request.  Referrals & Orders No orders of the defined types were placed in this encounter.   Follow-up Plan . Follow-up with Erica Pretty, FNP as planned . Schedule eye exam .    I have personally reviewed and noted the following in the patient's chart:   . Medical and social history . Use of alcohol, tobacco or illicit drugs  . Current medications and supplements . Functional ability and status . Nutritional status . Physical activity . Advanced directives . List of other physicians . Hospitalizations, surgeries, and ER visits in previous 12 months . Vitals . Screenings to include cognitive, depression, and falls . Referrals and appointments  In addition, I have reviewed and discussed with Erica [name] certain preventive protocols, quality metrics, and best practice recommendations. A written personalized care plan for preventive services as well as general preventive health recommendations is available and can be mailed to the patient  at her request.      Baldomero Lamy, LPN  [id number]   I have reviewed and agree with the above AWV documentation.    Mary-Margaret Hassell Done, FNP

## 2019-12-20 DIAGNOSIS — H2513 Age-related nuclear cataract, bilateral: Secondary | ICD-10-CM | POA: Diagnosis not present

## 2019-12-20 DIAGNOSIS — Z0100 Encounter for examination of eyes and vision without abnormal findings: Secondary | ICD-10-CM | POA: Diagnosis not present

## 2019-12-20 DIAGNOSIS — H5200 Hypermetropia, unspecified eye: Secondary | ICD-10-CM | POA: Diagnosis not present

## 2019-12-20 DIAGNOSIS — E7800 Pure hypercholesterolemia, unspecified: Secondary | ICD-10-CM | POA: Diagnosis not present

## 2020-01-07 DIAGNOSIS — R69 Illness, unspecified: Secondary | ICD-10-CM | POA: Diagnosis not present

## 2020-01-18 DIAGNOSIS — Z1231 Encounter for screening mammogram for malignant neoplasm of breast: Secondary | ICD-10-CM | POA: Diagnosis not present

## 2020-03-10 DIAGNOSIS — D239 Other benign neoplasm of skin, unspecified: Secondary | ICD-10-CM | POA: Diagnosis not present

## 2020-03-10 DIAGNOSIS — L570 Actinic keratosis: Secondary | ICD-10-CM | POA: Diagnosis not present

## 2020-03-10 DIAGNOSIS — Z85828 Personal history of other malignant neoplasm of skin: Secondary | ICD-10-CM | POA: Diagnosis not present

## 2020-03-17 ENCOUNTER — Ambulatory Visit: Payer: Self-pay | Admitting: Nurse Practitioner

## 2020-03-18 ENCOUNTER — Ambulatory Visit (INDEPENDENT_AMBULATORY_CARE_PROVIDER_SITE_OTHER): Payer: Medicare HMO

## 2020-03-18 ENCOUNTER — Encounter: Payer: Self-pay | Admitting: Nurse Practitioner

## 2020-03-18 ENCOUNTER — Ambulatory Visit (INDEPENDENT_AMBULATORY_CARE_PROVIDER_SITE_OTHER): Payer: Medicare HMO | Admitting: Nurse Practitioner

## 2020-03-18 ENCOUNTER — Other Ambulatory Visit: Payer: Self-pay

## 2020-03-18 VITALS — BP 153/70 | HR 50 | Temp 97.6°F | Resp 20 | Ht 64.0 in | Wt 158.0 lb

## 2020-03-18 DIAGNOSIS — K219 Gastro-esophageal reflux disease without esophagitis: Secondary | ICD-10-CM | POA: Diagnosis not present

## 2020-03-18 DIAGNOSIS — E785 Hyperlipidemia, unspecified: Secondary | ICD-10-CM

## 2020-03-18 DIAGNOSIS — M8588 Other specified disorders of bone density and structure, other site: Secondary | ICD-10-CM

## 2020-03-18 DIAGNOSIS — Z23 Encounter for immunization: Secondary | ICD-10-CM

## 2020-03-18 DIAGNOSIS — Z2883 Immunization not carried out due to unavailability of vaccine: Secondary | ICD-10-CM | POA: Diagnosis not present

## 2020-03-18 DIAGNOSIS — Z6827 Body mass index (BMI) 27.0-27.9, adult: Secondary | ICD-10-CM

## 2020-03-18 DIAGNOSIS — Z780 Asymptomatic menopausal state: Secondary | ICD-10-CM | POA: Diagnosis not present

## 2020-03-18 LAB — CBC WITH DIFFERENTIAL/PLATELET
Basophils Absolute: 0.1 10*3/uL (ref 0.0–0.2)
Basos: 1 %
EOS (ABSOLUTE): 0.2 10*3/uL (ref 0.0–0.4)
Eos: 3 %
Hematocrit: 44.6 % (ref 34.0–46.6)
Hemoglobin: 14.9 g/dL (ref 11.1–15.9)
Immature Grans (Abs): 0 10*3/uL (ref 0.0–0.1)
Immature Granulocytes: 0 %
Lymphocytes Absolute: 2 10*3/uL (ref 0.7–3.1)
Lymphs: 30 %
MCH: 31 pg (ref 26.6–33.0)
MCHC: 33.4 g/dL (ref 31.5–35.7)
MCV: 93 fL (ref 79–97)
Monocytes Absolute: 0.6 10*3/uL (ref 0.1–0.9)
Monocytes: 10 %
Neutrophils Absolute: 3.7 10*3/uL (ref 1.4–7.0)
Neutrophils: 56 %
Platelets: 277 10*3/uL (ref 150–450)
RBC: 4.8 x10E6/uL (ref 3.77–5.28)
RDW: 13.1 % (ref 11.7–15.4)
WBC: 6.5 10*3/uL (ref 3.4–10.8)

## 2020-03-18 LAB — LIPID PANEL
Chol/HDL Ratio: 3.2 ratio (ref 0.0–4.4)
Cholesterol, Total: 158 mg/dL (ref 100–199)
HDL: 50 mg/dL (ref >39)
LDL Chol Calc (NIH): 87 mg/dL (ref 0–99)
Triglycerides: 118 mg/dL (ref 0–149)
VLDL Cholesterol Cal: 21 mg/dL (ref 5–40)

## 2020-03-18 LAB — CMP14+EGFR
ALT: 26 IU/L (ref 0–32)
AST: 30 IU/L (ref 0–40)
Albumin/Globulin Ratio: 2.2 (ref 1.2–2.2)
Albumin: 4.7 g/dL (ref 3.8–4.8)
Alkaline Phosphatase: 114 IU/L (ref 44–121)
BUN/Creatinine Ratio: 18 (ref 12–28)
BUN: 13 mg/dL (ref 8–27)
Bilirubin Total: 0.4 mg/dL (ref 0.0–1.2)
CO2: 27 mmol/L (ref 20–29)
Calcium: 9.5 mg/dL (ref 8.7–10.3)
Chloride: 102 mmol/L (ref 96–106)
Creatinine, Ser: 0.71 mg/dL (ref 0.57–1.00)
GFR calc Af Amer: 101 mL/min/{1.73_m2} (ref >59)
GFR calc non Af Amer: 88 mL/min/{1.73_m2} (ref >59)
Globulin, Total: 2.1 g/dL (ref 1.5–4.5)
Glucose: 97 mg/dL (ref 65–99)
Potassium: 4.7 mmol/L (ref 3.5–5.2)
Sodium: 140 mmol/L (ref 134–144)
Total Protein: 6.8 g/dL (ref 6.0–8.5)

## 2020-03-18 MED ORDER — ATORVASTATIN CALCIUM 40 MG PO TABS: 40.0000 mg | ORAL_TABLET | Freq: Every day | ORAL | 1 refills | Status: DC

## 2020-03-18 NOTE — Patient Instructions (Signed)
DASH Eating Plan DASH stands for "Dietary Approaches to Stop Hypertension." The DASH eating plan is a healthy eating plan that has been shown to reduce high blood pressure (hypertension). It may also reduce your risk for type 2 diabetes, heart disease, and stroke. The DASH eating plan may also help with weight loss. What are tips for following this plan?  General guidelines  Avoid eating more than 2,300 mg (milligrams) of salt (sodium) a day. If you have hypertension, you may need to reduce your sodium intake to 1,500 mg a day.  Limit alcohol intake to no more than 1 drink a day for nonpregnant women and 2 drinks a day for men. One drink equals 12 oz of beer, 5 oz of wine, or 1 oz of hard liquor.  Work with your health care provider to maintain a healthy body weight or to lose weight. Ask what an ideal weight is for you.  Get at least 30 minutes of exercise that causes your heart to beat faster (aerobic exercise) most days of the week. Activities may include walking, swimming, or biking.  Work with your health care provider or diet and nutrition specialist (dietitian) to adjust your eating plan to your individual calorie needs. Reading food labels   Check food labels for the amount of sodium per serving. Choose foods with less than 5 percent of the Daily Value of sodium. Generally, foods with less than 300 mg of sodium per serving fit into this eating plan.  To find whole grains, look for the word "whole" as the first word in the ingredient list. Shopping  Buy products labeled as "low-sodium" or "no salt added."  Buy fresh foods. Avoid canned foods and premade or frozen meals. Cooking  Avoid adding salt when cooking. Use salt-free seasonings or herbs instead of table salt or sea salt. Check with your health care provider or pharmacist before using salt substitutes.  Do not fry foods. Cook foods using healthy methods such as baking, boiling, grilling, and broiling instead.  Cook with  heart-healthy oils, such as olive, canola, soybean, or sunflower oil. Meal planning  Eat a balanced diet that includes: ? 5 or more servings of fruits and vegetables each day. At each meal, try to fill half of your plate with fruits and vegetables. ? Up to 6-8 servings of whole grains each day. ? Less than 6 oz of lean meat, poultry, or fish each day. A 3-oz serving of meat is about the same size as a deck of cards. One egg equals 1 oz. ? 2 servings of low-fat dairy each day. ? A serving of nuts, seeds, or beans 5 times each week. ? Heart-healthy fats. Healthy fats called Omega-3 fatty acids are found in foods such as flaxseeds and coldwater fish, like sardines, salmon, and mackerel.  Limit how much you eat of the following: ? Canned or prepackaged foods. ? Food that is high in trans fat, such as fried foods. ? Food that is high in saturated fat, such as fatty meat. ? Sweets, desserts, sugary drinks, and other foods with added sugar. ? Full-fat dairy products.  Do not salt foods before eating.  Try to eat at least 2 vegetarian meals each week.  Eat more home-cooked food and less restaurant, buffet, and fast food.  When eating at a restaurant, ask that your food be prepared with less salt or no salt, if possible. What foods are recommended? The items listed may not be a complete list. Talk with your dietitian about   what dietary choices are best for you. Grains Whole-grain or whole-wheat bread. Whole-grain or whole-wheat pasta. Brown rice. Oatmeal. Quinoa. Bulgur. Whole-grain and low-sodium cereals. Pita bread. Low-fat, low-sodium crackers. Whole-wheat flour tortillas. Vegetables Fresh or frozen vegetables (raw, steamed, roasted, or grilled). Low-sodium or reduced-sodium tomato and vegetable juice. Low-sodium or reduced-sodium tomato sauce and tomato paste. Low-sodium or reduced-sodium canned vegetables. Fruits All fresh, dried, or frozen fruit. Canned fruit in natural juice (without  added sugar). Meat and other protein foods Skinless chicken or turkey. Ground chicken or turkey. Pork with fat trimmed off. Fish and seafood. Egg whites. Dried beans, peas, or lentils. Unsalted nuts, nut butters, and seeds. Unsalted canned beans. Lean cuts of beef with fat trimmed off. Low-sodium, lean deli meat. Dairy Low-fat (1%) or fat-free (skim) milk. Fat-free, low-fat, or reduced-fat cheeses. Nonfat, low-sodium ricotta or cottage cheese. Low-fat or nonfat yogurt. Low-fat, low-sodium cheese. Fats and oils Soft margarine without trans fats. Vegetable oil. Low-fat, reduced-fat, or light mayonnaise and salad dressings (reduced-sodium). Canola, safflower, olive, soybean, and sunflower oils. Avocado. Seasoning and other foods Herbs. Spices. Seasoning mixes without salt. Unsalted popcorn and pretzels. Fat-free sweets. What foods are not recommended? The items listed may not be a complete list. Talk with your dietitian about what dietary choices are best for you. Grains Baked goods made with fat, such as croissants, muffins, or some breads. Dry pasta or rice meal packs. Vegetables Creamed or fried vegetables. Vegetables in a cheese sauce. Regular canned vegetables (not low-sodium or reduced-sodium). Regular canned tomato sauce and paste (not low-sodium or reduced-sodium). Regular tomato and vegetable juice (not low-sodium or reduced-sodium). Pickles. Olives. Fruits Canned fruit in a light or heavy syrup. Fried fruit. Fruit in cream or butter sauce. Meat and other protein foods Fatty cuts of meat. Ribs. Fried meat. Bacon. Sausage. Bologna and other processed lunch meats. Salami. Fatback. Hotdogs. Bratwurst. Salted nuts and seeds. Canned beans with added salt. Canned or smoked fish. Whole eggs or egg yolks. Chicken or turkey with skin. Dairy Whole or 2% milk, cream, and half-and-half. Whole or full-fat cream cheese. Whole-fat or sweetened yogurt. Full-fat cheese. Nondairy creamers. Whipped toppings.  Processed cheese and cheese spreads. Fats and oils Butter. Stick margarine. Lard. Shortening. Ghee. Bacon fat. Tropical oils, such as coconut, palm kernel, or palm oil. Seasoning and other foods Salted popcorn and pretzels. Onion salt, garlic salt, seasoned salt, table salt, and sea salt. Worcestershire sauce. Tartar sauce. Barbecue sauce. Teriyaki sauce. Soy sauce, including reduced-sodium. Steak sauce. Canned and packaged gravies. Fish sauce. Oyster sauce. Cocktail sauce. Horseradish that you find on the shelf. Ketchup. Mustard. Meat flavorings and tenderizers. Bouillon cubes. Hot sauce and Tabasco sauce. Premade or packaged marinades. Premade or packaged taco seasonings. Relishes. Regular salad dressings. Where to find more information:  National Heart, Lung, and Blood Institute: www.nhlbi.nih.gov  American Heart Association: www.heart.org Summary  The DASH eating plan is a healthy eating plan that has been shown to reduce high blood pressure (hypertension). It may also reduce your risk for type 2 diabetes, heart disease, and stroke.  With the DASH eating plan, you should limit salt (sodium) intake to 2,300 mg a day. If you have hypertension, you may need to reduce your sodium intake to 1,500 mg a day.  When on the DASH eating plan, aim to eat more fresh fruits and vegetables, whole grains, lean proteins, low-fat dairy, and heart-healthy fats.  Work with your health care provider or diet and nutrition specialist (dietitian) to adjust your eating plan to your   individual calorie needs. This information is not intended to replace advice given to you by your health care provider. Make sure you discuss any questions you have with your health care provider. Document Revised: 05/27/2017 Document Reviewed: 06/07/2016 Elsevier Patient Education  2020 [employer]  

## 2020-03-18 NOTE — Progress Notes (Signed)
Subjective:    Patient ID: Erica [name], female    DOB: 11-Apr-1951, 69 y.o.   MRN: [id number]   Chief Complaint: medical management of chronic issues     HPI:  1. Hyperlipidemia with target LDL less than 100 Patient tries to wathc diet and walks for exercise.  Lab Results  Component Value Date   CHOL 164 09/10/2019   HDL 50 09/10/2019   LDLCALC 90 09/10/2019   TRIG 139 09/10/2019   CHOLHDL 3.3 09/10/2019      2. Gastroesophageal reflux disease without esophagitis On no meds and has not had any symptoms in several years. Says she stopped eating late at night and now it has gone away.  3. Osteopenia of lumbar spine Last dexascan was done 03/10/18 with t score of -1.8.  4. BMI 27.0-27.9,adult Weight is down 10lbs  Wt Readings from Last 3 Encounters:  03/18/20 158 lb (71.7 kg)  09/10/19 168 lb (76.2 kg)  03/13/19 164 lb (74.4 kg)   BMI Readings from Last 3 Encounters:  03/18/20 27.12 kg/m  09/10/19 28.84 kg/m  03/13/19 28.15 kg/m     Outpatient Encounter Medications as of 03/18/2020  Medication Sig  . atorvastatin (LIPITOR) 40 MG tablet Take 1 tablet (40 mg total) by mouth daily.  . cholecalciferol (VITAMIN D3) 25 MCG (1000 UT) tablet Take 1,000 Units by mouth daily.  . Coenzyme Q10 (CO Q-10) 200 MG CAPS Take by mouth.  Marland Kitchen KRILL OIL PO Take by mouth.  . Multiple Vitamins-Minerals (CENTRUM SILVER 50+WOMEN PO) Take 1 capsule by mouth daily.    Past Surgical History:  Procedure Laterality Date  . CHOLECYSTECTOMY N/A 05/14/2014   Procedure: LAPAROSCOPIC CHOLECYSTECTOMY WITH INTRAOPERATIVE CHOLANGIOGRAM;  Surgeon: Gayland Curry, MD;  Location: [facility];  Service: General;  Laterality: N/A;  . NECK SURGERY    . TONSILECTOMY/ADENOIDECTOMY WITH MYRINGOTOMY    . TUBAL LIGATION      Family History  Problem Relation Age of Onset  . Heart disease Father   . Stroke Sister   . Osteoporosis Mother   . Heart attack Brother   . Arthritis Daughter     New  complaints: Patient had covid vaccine and she want sto make sure she has antibodies.  Social history: Lives by herself  Controlled substance contract: n/a    Review of Systems  Constitutional: Negative for diaphoresis.  Eyes: Negative for pain.  Respiratory: Negative for shortness of breath.   Cardiovascular: Negative for chest pain, palpitations and leg swelling.  Gastrointestinal: Negative for abdominal pain.  Endocrine: Negative for polydipsia.  Skin: Negative for rash.  Neurological: Negative for dizziness, weakness and headaches.  Hematological: Does not bruise/bleed easily.  All other systems reviewed and are negative.      Objective:   Physical Exam Vitals and nursing note reviewed.  Constitutional:      General: She is not in acute distress.    Appearance: Normal appearance. She is well-developed.  HENT:     Head: Normocephalic.     Nose: Nose normal.  Eyes:     Pupils: Pupils are equal, round, and reactive to light.  Neck:     Vascular: No carotid bruit or JVD.  Cardiovascular:     Rate and Rhythm: Normal rate and regular rhythm.     Heart sounds: Normal heart sounds.  Pulmonary:     Effort: Pulmonary effort is normal. No respiratory distress.     Breath sounds: Normal breath sounds. No wheezing or rales.  Chest:  Chest wall: No tenderness.  Abdominal:     General: Bowel sounds are normal. There is no distension or abdominal bruit.     Palpations: Abdomen is soft. There is no hepatomegaly, splenomegaly, mass or pulsatile mass.     Tenderness: There is no abdominal tenderness.  Musculoskeletal:        General: Normal range of motion.     Cervical back: Normal range of motion and neck supple.  Lymphadenopathy:     Cervical: No cervical adenopathy.  Skin:    General: Skin is warm and dry.  Neurological:     Mental Status: She is alert and oriented to person, place, and time.     Deep Tendon Reflexes: Reflexes are normal and symmetric.  Psychiatric:         Behavior: Behavior normal.        Thought Content: Thought content normal.        Judgment: Judgment normal.    BP (!) 153/70   Pulse (!) 50   Temp 97.6 F (36.4 C) (Temporal)   Resp 20   Ht 5\' 4"  (1.626 m)   Wt 158 lb (71.7 kg)   SpO2 98%   BMI 27.12 kg/m          Assessment & Plan:  SISSY GOETZKE comes in today with chief complaint of Medical Management of Chronic Issues   Diagnosis and orders addressed:  1. Hyperlipidemia with target LDL less than 100 low fat diet - atorvastatin (LIPITOR) 40 MG tablet; Take 1 tablet (40 mg total) by mouth daily.  Dispense: 90 tablet; Refill: 1  2. Gastroesophageal reflux disease without esophagitis Avoid spicy foods Do not eat 2 hours prior to bedtime  3. Osteopenia of lumbar spine Weight bearing exercise - DG [facility] DEXA  4. BMI 27.0-27.9,adult Discussed diet and exercise for person with BMI >25 Will recheck weight in 3-6 months  Keep diary of blood pressure at home- report if higher then 381 systolic   Labs pending Health Maintenance reviewed Diet and exercise encouraged  Follow up plan: 6 months   [place], FNP

## 2020-03-18 NOTE — Addendum Note (Signed)
Addended by: Chevis Pretty on: 03/18/2020 08:54 AM   Modules accepted: Orders

## 2020-03-19 LAB — SARS-COV-2 SEMI-QUANTITATIVE TOTAL ANTIBODY, SPIKE
SARS-CoV-2 Semi-Quant Total Ab: 2451 U/mL (ref <0.8)
SARS-CoV-2 Spike Ab Interp: POSITIVE

## 2020-06-13 IMAGING — DX DG CHEST 2V
2 series · 2 of 2 positions shown · non-contrast
Comparison: None.

CLINICAL DATA: Smoker

EXAM:
CHEST - 2 VIEW

[chest pa]
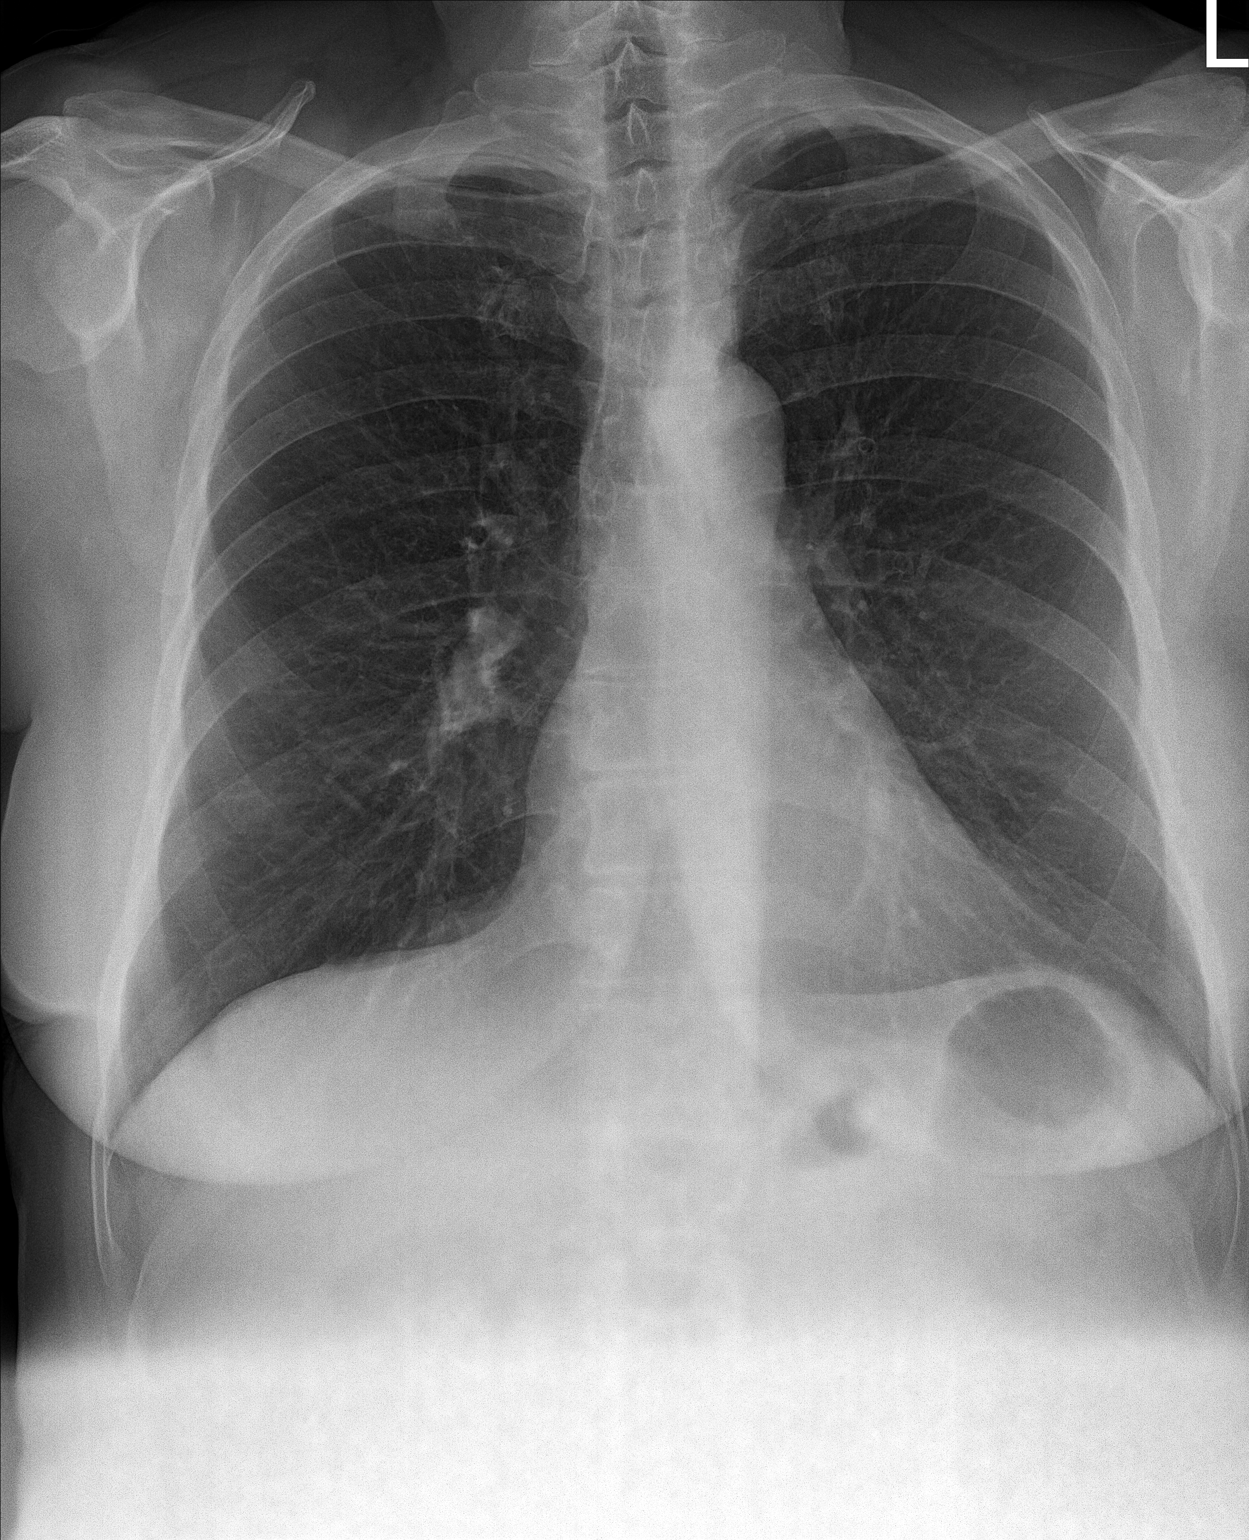

[chest lat]
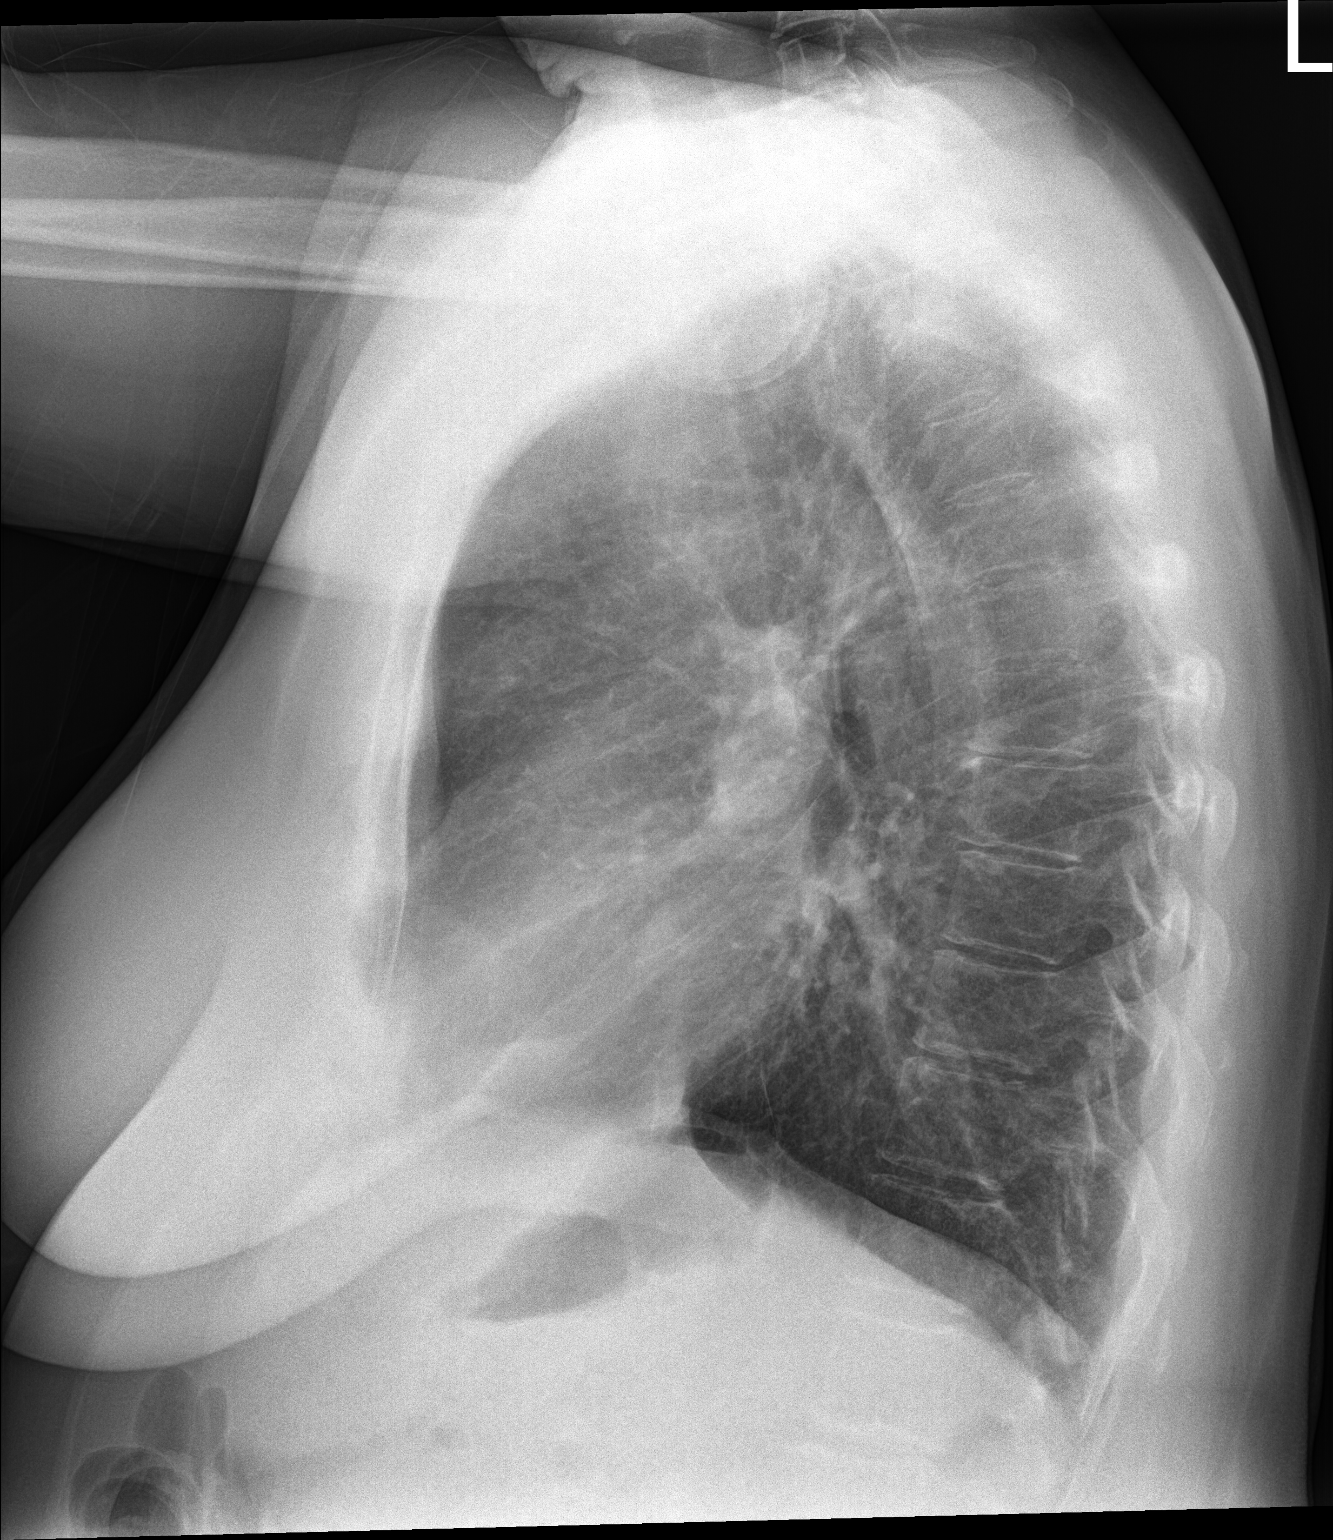

[2 of 2 positions shown; findings below may reference images not displayed]

FINDINGS: Normal heart size. There is increased density in the right hilum.
Possible 1.5 cm nodule at the posterior costophrenic angle. No
pneumothorax. No pleural effusion.
IMPRESSION: Possible posterior basilar lung nodule. Possible right hilar mass.
CT is recommended.

## 2020-09-16 ENCOUNTER — Ambulatory Visit (INDEPENDENT_AMBULATORY_CARE_PROVIDER_SITE_OTHER): Payer: Self-pay | Admitting: Nurse Practitioner

## 2020-09-16 DIAGNOSIS — Z5329 Procedure and treatment not carried out because of patient's decision for other reasons: Secondary | ICD-10-CM

## 2020-09-17 ENCOUNTER — Encounter: Payer: Self-pay | Admitting: Nurse Practitioner

## 2020-09-17 ENCOUNTER — Ambulatory Visit (INDEPENDENT_AMBULATORY_CARE_PROVIDER_SITE_OTHER): Payer: Medicare HMO | Admitting: Nurse Practitioner

## 2020-09-17 ENCOUNTER — Other Ambulatory Visit: Payer: Self-pay

## 2020-09-17 VITALS — BP 136/82 | HR 82 | Temp 97.6°F | Resp 20 | Ht 64.0 in | Wt 163.0 lb

## 2020-09-17 DIAGNOSIS — K219 Gastro-esophageal reflux disease without esophagitis: Secondary | ICD-10-CM

## 2020-09-17 DIAGNOSIS — Z6827 Body mass index (BMI) 27.0-27.9, adult: Secondary | ICD-10-CM | POA: Diagnosis not present

## 2020-09-17 DIAGNOSIS — E785 Hyperlipidemia, unspecified: Secondary | ICD-10-CM

## 2020-09-17 DIAGNOSIS — M8588 Other specified disorders of bone density and structure, other site: Secondary | ICD-10-CM

## 2020-09-17 LAB — CBC WITH DIFFERENTIAL/PLATELET
Basophils Absolute: 0.1 10*3/uL (ref 0.0–0.2)
Basos: 1 %
EOS (ABSOLUTE): 0.2 10*3/uL (ref 0.0–0.4)
Eos: 3 %
Hematocrit: 45.3 % (ref 34.0–46.6)
Hemoglobin: 15.2 g/dL (ref 11.1–15.9)
Immature Grans (Abs): 0 10*3/uL (ref 0.0–0.1)
Immature Granulocytes: 0 %
Lymphocytes Absolute: 2.3 10*3/uL (ref 0.7–3.1)
Lymphs: 32 %
MCH: 31 pg (ref 26.6–33.0)
MCHC: 33.6 g/dL (ref 31.5–35.7)
MCV: 92 fL (ref 79–97)
Monocytes Absolute: 0.7 10*3/uL (ref 0.1–0.9)
Monocytes: 10 %
Neutrophils Absolute: 4 10*3/uL (ref 1.4–7.0)
Neutrophils: 54 %
Platelets: 277 10*3/uL (ref 150–450)
RBC: 4.9 x10E6/uL (ref 3.77–5.28)
RDW: 13.1 % (ref 11.7–15.4)
WBC: 7.2 10*3/uL (ref 3.4–10.8)

## 2020-09-17 LAB — LIPID PANEL
Chol/HDL Ratio: 3.2 ratio (ref 0.0–4.4)
Cholesterol, Total: 175 mg/dL (ref 100–199)
HDL: 54 mg/dL (ref >39)
LDL Chol Calc (NIH): 98 mg/dL (ref 0–99)
Triglycerides: 130 mg/dL (ref 0–149)
VLDL Cholesterol Cal: 23 mg/dL (ref 5–40)

## 2020-09-17 LAB — CMP14+EGFR
ALT: 24 IU/L (ref 0–32)
AST: 30 IU/L (ref 0–40)
Albumin/Globulin Ratio: 1.8 (ref 1.2–2.2)
Albumin: 4.4 g/dL (ref 3.8–4.8)
Alkaline Phosphatase: 100 IU/L (ref 44–121)
BUN/Creatinine Ratio: 15 (ref 12–28)
BUN: 11 mg/dL (ref 8–27)
Bilirubin Total: 0.4 mg/dL (ref 0.0–1.2)
CO2: 23 mmol/L (ref 20–29)
Calcium: 9.5 mg/dL (ref 8.7–10.3)
Chloride: 104 mmol/L (ref 96–106)
Creatinine, Ser: 0.72 mg/dL (ref 0.57–1.00)
Globulin, Total: 2.4 g/dL (ref 1.5–4.5)
Glucose: 92 mg/dL (ref 65–99)
Potassium: 4.8 mmol/L (ref 3.5–5.2)
Sodium: 143 mmol/L (ref 134–144)
Total Protein: 6.8 g/dL (ref 6.0–8.5)
eGFR: 90 mL/min/{1.73_m2} (ref >59)

## 2020-09-17 MED ORDER — ATORVASTATIN CALCIUM 40 MG PO TABS: 40.0000 mg | ORAL_TABLET | Freq: Every day | ORAL | 1 refills | Status: DC

## 2020-09-17 NOTE — Patient Instructions (Signed)
Exercising to Stay Healthy To become healthy and stay healthy, it is recommended that you do moderate-intensity and vigorous-intensity exercise. You can tell that you are exercising at a moderate intensity if your heart starts beating faster and you start breathing faster but can still hold a conversation. You can tell that you are exercising at a vigorous intensity if you are breathing much harder and faster and cannot hold a conversation while exercising. Exercising regularly is important. It has many health benefits, such as:  Improving overall fitness, flexibility, and endurance.  Increasing bone density.  Helping with weight control.  Decreasing body fat.  Increasing muscle strength.  Reducing stress and tension.  Improving overall health. How often should I exercise? Choose an activity that you enjoy, and set realistic goals. Your health care provider can help you make an activity plan that works for you. Exercise regularly as told by your health care provider. This may include:  Doing strength training two times a week, such as: ? Lifting weights. ? Using resistance bands. ? Push-ups. ? Sit-ups. ? Yoga.  Doing a certain intensity of exercise for a given amount of time. Choose from these options: ? A total of 150 minutes of moderate-intensity exercise every week. ? A total of 75 minutes of vigorous-intensity exercise every week. ? A mix of moderate-intensity and vigorous-intensity exercise every week. Children, pregnant women, people who have not exercised regularly, people who are overweight, and older adults may need to talk with a health care provider about what activities are safe to do. If you have a medical condition, be sure to talk with your health care provider before you start a new exercise program. What are some exercise ideas? Moderate-intensity exercise ideas include:  Walking 1 mile (1.6 km) in about 15  minutes.  Biking.  Hiking.  Golfing.  Dancing.  Water aerobics. Vigorous-intensity exercise ideas include:  Walking 4.5 miles (7.2 km) or more in about 1 hour.  Jogging or running 5 miles (8 km) in about 1 hour.  Biking 10 miles (16.1 km) or more in about 1 hour.  Lap swimming.  Roller-skating or in-line skating.  Cross-country skiing.  Vigorous competitive sports, such as football, basketball, and soccer.  Jumping rope.  Aerobic dancing.   What are some everyday activities that can help me to get exercise?  Yard work, such as: ? Pushing a lawn mower. ? Raking and bagging leaves.  Washing your car.  Pushing a stroller.  Shoveling snow.  Gardening.  Washing windows or floors. How can I be more active in my day-to-day activities?  Use stairs instead of an elevator.  Take a walk during your lunch break.  If you drive, park your car farther away from your work or school.  If you take public transportation, get off one stop early and walk the rest of the way.  Stand up or walk around during all of your indoor phone calls.  Get up, stretch, and walk around every 30 minutes throughout the day.  Enjoy exercise with a friend. Support to continue exercising will help you keep a regular routine of activity. What guidelines can I follow while exercising?  Before you start a new exercise program, talk with your health care provider.  Do not exercise so much that you hurt yourself, feel dizzy, or get very short of breath.  Wear comfortable clothes and wear shoes with good support.  Drink plenty of water while you exercise to prevent dehydration or heat stroke.  Work out until   your breathing and your heartbeat get faster. Where to find more information  [place] Department of Health and Human Services: www.hhs.gov  Centers for Disease Control and Prevention (CDC): www.cdc.gov Summary  Exercising regularly is important. It will improve your overall fitness,  flexibility, and endurance.  Regular exercise also will improve your overall health. It can help you control your weight, reduce stress, and improve your bone density.  Do not exercise so much that you hurt yourself, feel dizzy, or get very short of breath.  Before you start a new exercise program, talk with your health care provider. This information is not intended to replace advice given to you by your health care provider. Make sure you discuss any questions you have with your health care provider. Document Revised: 05/27/2017 Document Reviewed: 05/05/2017 Elsevier Patient Education  2021 Elsevier Inc.  

## 2020-09-17 NOTE — Progress Notes (Signed)
Subjective:    Patient ID: Erica [name], female    DOB: 10/12/1950, 70 y.o.   MRN: [id number]   Chief Complaint: medical management of chronic issues     HPI:  1. Hyperlipidemia with target LDL less than 100 Does try to watch diet and walks for exercise several times a week. . Lab Results  Component Value Date   CHOL 158 03/18/2020   HDL 50 03/18/2020   LDLCALC 87 03/18/2020   TRIG 118 03/18/2020   CHOLHDL 3.2 03/18/2020   The 10-year ASCVD risk score Mikey Bussing DC Jr., et al., 2013) is: 11.2%   2. Gastroesophageal reflux disease without esophagitis has had no symptoms recently. If she doe sot eat prior to going to bed then she has no symptoms.  3. Osteopenia of lumbar spine Last dexascan was done 03/18/20. t score was -2.0. she is on vitamin d and calcium daily.  4. BMI 27.0-27.9,adult No recent weight changes  Wt Readings from Last 3 Encounters:  09/17/20 163 lb (73.9 kg)  03/18/20 158 lb (71.7 kg)  09/10/19 168 lb (76.2 kg)   BMI Readings from Last 3 Encounters:  09/17/20 27.98 kg/m  03/18/20 27.12 kg/m  09/10/19 28.84 kg/m      Outpatient Encounter Medications as of 09/17/2020  Medication Sig  . aspirin EC 81 MG tablet Take 81 mg by mouth daily. Swallow whole.  Marland Kitchen atorvastatin (LIPITOR) 40 MG tablet Take 1 tablet (40 mg total) by mouth daily.  . cholecalciferol (VITAMIN D3) 25 MCG (1000 UT) tablet Take 1,000 Units by mouth daily.  . Coenzyme Q10 (CO Q-10) 200 MG CAPS Take by mouth.  Marland Kitchen KRILL OIL PO Take by mouth.  . Multiple Vitamins-Minerals (CENTRUM SILVER 50+WOMEN PO) Take 1 capsule by mouth daily.   No facility-administered encounter medications on file as of 09/17/2020.    Past Surgical History:  Procedure Laterality Date  . CHOLECYSTECTOMY N/A 05/14/2014   Procedure: LAPAROSCOPIC CHOLECYSTECTOMY WITH INTRAOPERATIVE CHOLANGIOGRAM;  Surgeon: Gayland Curry, MD;  Location: [facility];  Service: General;  Laterality: N/A;  . NECK SURGERY    .  TONSILECTOMY/ADENOIDECTOMY WITH MYRINGOTOMY    . TUBAL LIGATION      Family History  Problem Relation Age of Onset  . Heart disease Father   . Stroke Sister   . Osteoporosis Mother   . Heart attack Brother   . Arthritis Daughter     New complaints: None today  Social history: Lives by herself. Family and friends check on her daily.  Controlled substance contract: n/a    Review of Systems  Constitutional: Negative for diaphoresis.  Eyes: Negative for pain.  Respiratory: Negative for shortness of breath.   Cardiovascular: Negative for chest pain, palpitations and leg swelling.  Gastrointestinal: Negative for abdominal pain.  Endocrine: Negative for polydipsia.  Skin: Negative for rash.  Neurological: Negative for dizziness, weakness and headaches.  Hematological: Does not bruise/bleed easily.  All other systems reviewed and are negative.      Objective:   Physical Exam Vitals and nursing note reviewed.  Constitutional:      General: She is not in acute distress.    Appearance: Normal appearance. She is well-developed.  HENT:     Head: Normocephalic.     Nose: Nose normal.  Eyes:     Pupils: Pupils are equal, round, and reactive to light.  Neck:     Vascular: No carotid bruit or JVD.  Cardiovascular:     Rate and Rhythm: Normal rate  and regular rhythm.     Heart sounds: Normal heart sounds.  Pulmonary:     Effort: Pulmonary effort is normal. No respiratory distress.     Breath sounds: Normal breath sounds. No wheezing or rales.  Chest:     Chest wall: No tenderness.  Abdominal:     General: Bowel sounds are normal. There is no distension or abdominal bruit.     Palpations: Abdomen is soft. There is no hepatomegaly, splenomegaly, mass or pulsatile mass.     Tenderness: There is no abdominal tenderness.  Musculoskeletal:        General: Normal range of motion.     Cervical back: Normal range of motion and neck supple.  Lymphadenopathy:     Cervical: No  cervical adenopathy.  Skin:    General: Skin is warm and dry.  Neurological:     Mental Status: She is alert and oriented to person, place, and time.     Deep Tendon Reflexes: Reflexes are normal and symmetric.  Psychiatric:        Behavior: Behavior normal.        Thought Content: Thought content normal.        Judgment: Judgment normal.     BP 136/82   Pulse 82   Temp 97.6 F (36.4 C) (Temporal)   Resp 20   Ht _0  (1.626 m)   Wt 163 lb (73.9 kg)   SpO2 93%   BMI 27.98 kg/m         Assessment & Plan:  Erica [name] comes in today with chief complaint of Medical Management of Chronic Issues   Diagnosis and orders addressed:  1. Hyperlipidemia with target LDL less than 100 Low fat diet - CBC with Differential/Platelet - CMP14+EGFR - Lipid panel - atorvastatin (LIPITOR) 40 MG tablet; Take 1 tablet (40 mg total) by mouth daily.  Dispense: 90 tablet; Refill: 1  2. Gastroesophageal reflux disease without esophagitis Avoid spicy foods Do not eat 2 hours prior to bedtime  3. Osteopenia of lumbar spine weight bearing exercises Continue vitamind and calcium supplement  4. BMI 27.0-27.9,adult Discussed diet and exercise for person with BMI >25 Will recheck weight in 3-6 months   Labs pending Health Maintenance reviewed Diet and exercise encouraged  Follow up plan: 6 months   Mary-Margaret Hassell Done, FNP

## 2020-09-29 ENCOUNTER — Ambulatory Visit (INDEPENDENT_AMBULATORY_CARE_PROVIDER_SITE_OTHER): Payer: Medicare HMO

## 2020-09-29 DIAGNOSIS — Z0000 Encounter for general adult medical examination without abnormal findings: Secondary | ICD-10-CM

## 2020-09-29 NOTE — Progress Notes (Signed)
Subjective:   FANCY DUNKLEY is a 70 y.o. female who presents for Medicare Annual (Subsequent) preventive examination.  Virtual Visit via Telephone Note  I connected with  [name] on 09/29/20 at  8:15 AM EDT by telephone and verified that I am speaking with the correct person using two identifiers.  Location: Patient: Home Provider: [facility] Persons participating in the virtual visit: patient/Nurse Health Advisor   I discussed the limitations, risks, security and privacy concerns of performing an evaluation and management service by telephone and the availability of in person appointments. The patient expressed understanding and agreed to proceed.  Interactive audio and video telecommunications were attempted between this nurse and patient, however failed, due to patient having technical difficulties OR patient did not have access to video capability.  We continued and completed visit with audio only.  Some vital signs may be absent or patient reported.   Zianna Dercole E Zabria Liss, LPN   Review of Systems     Cardiac Risk Factors include: advanced age (>92men, >3 women);dyslipidemia     Objective:    There were no vitals filed for this visit. There is no height or weight on file to calculate BMI.  Advanced Directives 09/29/2020 09/27/2019 12/20/2017 05/09/2014  Does Patient Have a Medical Advance Directive? Yes Yes Yes No  Type of Paramedic of [place];Living will [place];Living will;Out of facility DNR (pink MOST or yellow form) - -  Copy of [place] in Chart? No - copy requested - - -  Would patient like information on creating a medical advance directive? - - - Yes - Educational materials given    Current Medications (verified) Outpatient Encounter Medications as of 09/29/2020  Medication Sig  . aspirin EC 81 MG tablet Take 81 mg by mouth daily. Swallow whole.  Marland Kitchen atorvastatin (LIPITOR) 40 MG tablet Take 1 tablet (40  mg total) by mouth daily.  . calcium carbonate (OSCAL) 1500 (600 Ca) MG TABS tablet Take by mouth daily with breakfast.  . cholecalciferol (VITAMIN D3) 25 MCG (1000 UT) tablet Take 1,000 Units by mouth daily.  . Coenzyme Q10 (CO Q-10) 200 MG CAPS Take by mouth.  Marland Kitchen KRILL OIL PO Take by mouth.  . Multiple Vitamins-Minerals (CENTRUM SILVER 50+WOMEN PO) Take 1 capsule by mouth daily.   No facility-administered encounter medications on file as of 09/29/2020.    Allergies (verified) Povidone-iodine and Statins   History: Past Medical History:  Diagnosis Date  . Complication of anesthesia   . Hyperlipidemia   . Osteopenia    Past Surgical History:  Procedure Laterality Date  . CHOLECYSTECTOMY N/A 05/14/2014   Procedure: LAPAROSCOPIC CHOLECYSTECTOMY WITH INTRAOPERATIVE CHOLANGIOGRAM;  Surgeon: Gayland Curry, MD;  Location: [facility];  Service: General;  Laterality: N/A;  . NECK SURGERY    . TONSILECTOMY/ADENOIDECTOMY WITH MYRINGOTOMY    . TUBAL LIGATION     Family History  Problem Relation Age of Onset  . Heart disease Father   . Stroke Sister   . Osteoporosis Mother   . Heart attack Brother   . Arthritis Daughter    Social History   Socioeconomic History  . Marital status: Divorced    Spouse name: Not on file  . Number of children: 3  . Years of education: ged  . Highest education level: GED or equivalent  Occupational History  . Occupation: Charity fundraiser    Comment: retired  Tobacco Use  . Smoking status: Former Smoker  Packs/day: 1.00    Years: 40.00    Pack years: 40.00    Types: Cigarettes    Quit date: 06/29/2007    Years since quitting: 13.2  . Smokeless tobacco: Never Used  Vaping Use  . Vaping Use: Never used  Substance and Sexual Activity  . Alcohol use: Yes    Alcohol/week: 1.0 standard drink    Types: 1 Glasses of wine per week    Comment: rarely  . Drug use: No  . Sexual activity: Not on file  Other Topics Concern  . 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 She is divorced and lives alone. She has 2 daughters and 1 son. She sees her children regularly. She enjoys golf and gardening.    Social Determinants of Health   Financial Resource Strain: Low Risk   . Difficulty of Paying Living Expenses: Not hard at all  Food Insecurity: No Food Insecurity  . Worried About Charity fundraiser in the Last Year: Never true  . Ran Out of Food in the Last Year: Never true  Transportation Needs: No Transportation Needs  . Lack of Transportation (Medical): No  . Lack of Transportation (Non-Medical): No  Physical Activity: Sufficiently Active  . Days of Exercise per Week: 7 days  . Minutes of Exercise per Session: 30 min  Stress: No Stress Concern Present  . Feeling of Stress : Not at all  Social Connections: Socially Isolated  . Frequency of Communication with Friends and Family: More than three times a week  . Frequency of Social Gatherings with Friends and Family: Twice a week  . Attends Religious Services: Never  . Active Member of Clubs or Organizations: No  . Attends Archivist Meetings: Never  . Marital Status: Divorced    Tobacco Counseling Counseling given: Not Answered   Clinical Intake:  Pre-visit preparation completed: Yes  Pain : No/denies pain     BMI - recorded: 27.98 Nutritional Status: BMI 25 -29 Overweight Nutritional Risks: None Diabetes: No  How often do you need to have someone help you when you read instructions, pamphlets, or other written materials from your doctor or pharmacy?: 1 - Never  Diabetic? No  Interpreter Needed?: No  Information entered by :: Chrystina Naff, LPN   Activities of Daily Living In your present state of health, do you have any difficulty performing the following activities: 09/29/2020  Hearing? N  Vision? N  Difficulty concentrating or making decisions? N  Walking or climbing stairs? N  Dressing or bathing? N  Doing errands, shopping? N   Preparing Food and eating ? N  Using the Toilet? N  In the past six months, have you accidently leaked urine? N  Do you have problems with loss of bowel control? N  Managing your Medications? N  Managing your Finances? N  Housekeeping or managing your Housekeeping? N  Some recent data might be hidden    Patient Care Team: Chevis Pretty, FNP as PCP - General (Nurse Practitioner) Sandford Craze, MD as Referring Physician (Dermatology)  Indicate any recent Medical Services you may have received from other than [facility] providers in the past year (date may be approximate).     Assessment:   This is a routine wellness examination for Axelle.  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 Exercise 3x per week (30 min per time)     Continue walking at least 30 minutes daily    . Patient Stated     09/27/2019 AWV Goal: Exercise for General Health   Patient will verbalize understanding of the benefits of increased physical activity:  Exercising regularly is important. It will improve your overall fitness, flexibility, and endurance.  Regular exercise also will improve your overall health. It can help you control your weight, reduce stress, and improve your bone density.  Over the next year, patient will increase physical activity as tolerated with a goal of at least 150 minutes of moderate physical activity per week.   You can tell that you are exercising at a moderate intensity if your heart  starts beating faster and you start breathing faster but can still hold a conversation.  Moderate-intensity exercise ideas include:  Walking 1 mile (1.6 km) in about 15 minutes  Biking  Hiking  Golfing  Dancing  Water aerobics  Patient will verbalize understanding of everyday activities that increase physical activity by providing examples like the following: ? Yard work, such as: ? Pushing a Conservation officer, nature ? Raking and bagging leaves ? Washing your car ? Pushing a stroller ? Shoveling snow ? Gardening ? Washing windows or floors  Patient will be able to explain general safety guidelines for exercising:   Before you start a new exercise program, talk with your health care provider.  Do not exercise so much that you hurt yourself, feel dizzy, or get very short of breath.  Wear comfortable clothes and wear shoes with good support.  Drink plenty of water while you exercise to prevent dehydration or heat stroke.  Work out until your breathing and your heartbeat get faster.       Depression Screen PHQ 2/9 Scores 09/29/2020 09/17/2020 03/18/2020 09/27/2019 09/10/2019 03/13/2019 01/26/2019  PHQ - 2 Score 0 0 0 0 0 0 0    Fall Risk Fall Risk  09/29/2020 09/17/2020 03/18/2020 09/27/2019 09/10/2019  Falls in the past year? 1 1 0 0 0  Number falls in past yr: 0 0 - - -  Injury with Fall? 0 0 - - -  Comment playing golf, slipped up - no injury - - - -  Risk for fall due to : No Fall Risks History of fall(s) - - -  Follow up Falls prevention discussed Education provided - - -    FALL RISK PREVENTION PERTAINING TO THE HOME:  Any stairs in or around the home? Yes  If so, are there any without handrails? No  Home free of loose throw rugs in walkways, pet beds, electrical cords, etc? Yes  Adequate lighting in your home to reduce risk of falls? Yes   ASSISTIVE DEVICES UTILIZED TO PREVENT FALLS:  Life alert? No  Use of a cane, walker or w/c? No  Grab bars in the bathroom? Yes  Shower chair  or bench in shower? No  Elevated toilet seat or a handicapped toilet? No   TIMED UP AND GO:  Was the test performed? No . Telephonic visit  Cognitive Function: Normal cognitive status assessed  by direct observation by this Nurse Health Advisor. No abnormalities found.      6CIT Screen 09/27/2019  What Year? 0 points  What month? 0 points  What time? 0 points  Count back from 20 0 points  Months in reverse 0 points  Repeat phrase 0 points  Total Score 0    Immunizations Immunization History  Administered Date(s) Administered  . Fluad Quad(high Dose 65+) 03/13/2019, 03/18/2020  . Influenza Split 04/17/2013  . Influenza, High Dose Seasonal PF 04/12/2017, 04/17/2018  . Influenza-Unspecified 04/16/2014, 04/08/2015, 04/06/2016  . Moderna SARS-COV2 Booster Vaccination 05/15/2020  . Moderna Sars-Covid-2 Vaccination 08/03/2019, 08/31/2019  . Pneumococcal Conjugate-13 03/10/2018  . Pneumococcal Polysaccharide-23 03/13/2019  . Tdap 01/26/2019  . Zoster 04/21/2011    TDAP status: Up to date  Flu Vaccine status: Up to date  Pneumococcal vaccine status: Up to date  Covid-19 vaccine status: Completed vaccines  Qualifies for Shingles Vaccine? Yes   Zostavax completed Yes   Shingrix Completed?: No.    Education has been provided regarding the importance of this vaccine. Patient has been advised to call insurance company to determine out of pocket expense if they have not yet received this vaccine. Advised may also receive vaccine at local pharmacy or Health Dept. Verbalized acceptance and understanding.  Screening Tests Health Maintenance  Topic Date Due  . MAMMOGRAM  01/17/2021  . INFLUENZA VACCINE  01/26/2021  . DEXA SCAN  03/18/2022  . COLONOSCOPY (Pts 45-84yrs Insurance coverage will need to be confirmed)  05/16/2028  . TETANUS/TDAP  01/25/2029  . COVID-19 Vaccine  Completed  . Hepatitis C Screening  Completed  . PNA vac Low Risk Adult  Completed  . HPV VACCINES  Aged Out     Health Maintenance  There are no preventive care reminders to display for this patient.  Colorectal cancer screening: Type of screening: Colonoscopy. Completed 05/16/2018. Repeat every 10 years  Mammogram status: Completed 01/16/2020. Repeat every year  Bone Density status: Completed 03/18/2020. Results reflect: Bone density results: OSTEOPENIA. Repeat every 2 years.  Lung Cancer Screening: (Low Dose CT Chest recommended if Age 68-80 years, 30 pack-year currently smoking OR have quit w/in 15years.) does not qualify.   Additional Screening:  Hepatitis C Screening: does qualify; Completed 10/21/2015  Vision Screening: Recommended annual ophthalmology exams for early detection of glaucoma and other disorders of the eye. Is the patient up to date with their annual eye exam?  Yes  Who is the provider or what is the name of the office in which the patient attends annual eye exams? [facility] If pt is not established with a provider, would they like to be referred to a provider to establish care? No .   Dental Screening: Recommended annual dental exams for proper oral hygiene  Community Resource Referral / Chronic Care Management: CRR required this visit?  No   CCM required this visit?  No      Plan:     I have personally reviewed and noted the following in the patient's chart:   . Medical and social history . Use of alcohol, tobacco or illicit drugs  . Current medications and supplements . Functional ability and status . Nutritional status . Physical activity . Advanced directives . List of other physicians . Hospitalizations, surgeries, and ER visits in previous 12 months . Vitals . Screenings to include cognitive, depression, and falls . Referrals and appointments  In addition, I have reviewed and discussed with patient certain preventive protocols, quality metrics, and best  practice recommendations. A written personalized care plan for preventive services as well  as general preventive health recommendations were provided to patient.     Sandrea Hammond, LPN   08/04/8339   Nurse Notes: None

## 2020-09-29 NOTE — Patient Instructions (Signed)
Erica [name] , Thank you for taking time to come for your Medicare Wellness Visit. I appreciate your ongoing commitment to your health goals. Please review the following plan we discussed and let me know if I can assist you in the future.   Screening recommendations/referrals: Colonoscopy: Done 05/16/2018 - Repeat in 2029 Mammogram: Done 01/24/2020 - Repeat in 1 year Bone Density:  Done 03/18/2020 - Repeat in 2 years Recommended yearly ophthalmology/optometry visit for glaucoma screening and checkup Recommended yearly dental visit for hygiene and checkup  Vaccinations: Influenza vaccine: Done 03/18/2020 - Repeat 1 year Pneumococcal vaccine: Complete 03/10/2018 & 03/13/2019 Tdap vaccine: Done 01/26/2019 - Repeat 10 years Shingles vaccine: Shingrix discussed. Please contact your pharmacy for coverage information.    Covid-19: Complete: 08/03/2019, 08/31/2019, & 05/15/2020  Advanced directives: Please bring a copy of your health care power of attorney and living will to the office to be added to your chart at your convenience.  Conditions/risks identified: Keep up the great work! Continue working on diet and exercise and stay active!  Next appointment: Follow up in one year for your annual wellness visit    Preventive Care 65 Years and Older, Female Preventive care refers to lifestyle choices and visits with your health care provider that can promote health and wellness. What does preventive care include?  A yearly physical exam. This is also called an annual well check.  Dental exams once or twice a year.  Routine eye exams. Ask your health care provider how often you should have your eyes checked.  Personal lifestyle choices, including:  Daily care of your teeth and gums.  Regular physical activity.  Eating a healthy diet.  Avoiding tobacco and drug use.  Limiting alcohol use.  Practicing safe sex.  Taking low-dose aspirin every day.  Taking vitamin and mineral supplements as  recommended by your health care provider. What happens during an annual well check? The services and screenings done by your health care provider during your annual well check will depend on your age, overall health, lifestyle risk factors, and family history of disease. Counseling  Your health care provider may ask you questions about your:  Alcohol use.  Tobacco use.  Drug use.  Emotional well-being.  Home and relationship well-being.  Sexual activity.  Eating habits.  History of falls.  Memory and ability to understand (cognition).  Work and work Statistician.  Reproductive health. Screening  You may have the following tests or measurements:  Height, weight, and BMI.  Blood pressure.  Lipid and cholesterol levels. These may be checked every 5 years, or more frequently if you are over 71 years old.  Skin check.  Lung cancer screening. You may have this screening every year starting at age 29 if you have a 30-pack-year history of smoking and currently smoke or have quit within the past 15 years.  Fecal occult blood test (FOBT) of the stool. You may have this test every year starting at age 36.  Flexible sigmoidoscopy or colonoscopy. You may have a sigmoidoscopy every 5 years or a colonoscopy every 10 years starting at age 43.  Hepatitis C blood test.  Hepatitis B blood test.  Sexually transmitted disease (STD) testing.  Diabetes screening. This is done by checking your blood sugar (glucose) after you have not eaten for a while (fasting). You may have this done every 1-3 years.  Bone density scan. This is done to screen for osteoporosis. You may have this done starting at age 42.  Mammogram. This may  be done every 1-2 years. Talk to your health care provider about how often you should have regular mammograms. Talk with your health care provider about your test results, treatment options, and if necessary, the need for more tests. Vaccines  Your health care  provider may recommend certain vaccines, such as:  Influenza vaccine. This is recommended every year.  Tetanus, diphtheria, and acellular pertussis (Tdap, Td) vaccine. You may need a Td booster every 10 years.  Zoster vaccine. You may need this after age 62.  Pneumococcal 13-valent conjugate (PCV13) vaccine. One dose is recommended after age 100.  Pneumococcal polysaccharide (PPSV23) vaccine. One dose is recommended after age 20. Talk to your health care provider about which screenings and vaccines you need and how often you need them. This information is not intended to replace advice given to you by your health care provider. Make sure you discuss any questions you have with your health care provider. Document Released: 07/11/2015 Document Revised: 03/03/2016 Document Reviewed: 04/15/2015 Elsevier Interactive Patient Education  2017 [employer] Prevention in the Home Falls can cause injuries. They can happen to people of all ages. There are many things you can do to make your home safe and to help prevent falls. What can I do on the outside of my home?  Regularly fix the edges of walkways and driveways and fix any cracks.  Remove anything that might make you trip as you walk through a door, such as a raised step or threshold.  Trim any bushes or trees on the path to your home.  Use bright outdoor lighting.  Clear any walking paths of anything that might make someone trip, such as rocks or tools.  Regularly check to see if handrails are loose or broken. Make sure that both sides of any steps have handrails.  Any raised decks and porches should have guardrails on the edges.  Have any leaves, snow, or ice cleared regularly.  Use sand or salt on walking paths during winter.  Clean up any spills in your garage right away. This includes oil or grease spills. What can I do in the bathroom?  Use night lights.  Install grab bars by the toilet and in the tub and shower. Do  not use towel bars as grab bars.  Use non-skid mats or decals in the tub or shower.  If you need to sit down in the shower, use a plastic, non-slip stool.  Keep the floor dry. Clean up any water that spills on the floor as soon as it happens.  Remove soap buildup in the tub or shower regularly.  Attach bath mats securely with double-sided non-slip rug tape.  Do not have throw rugs and other things on the floor that can make you trip. What can I do in the bedroom?  Use night lights.  Make sure that you have a light by your bed that is easy to reach.  Do not use any sheets or blankets that are too big for your bed. They should not hang down onto the floor.  Have a firm chair that has side arms. You can use this for support while you get dressed.  Do not have throw rugs and other things on the floor that can make you trip. What can I do in the kitchen?  Clean up any spills right away.  Avoid walking on wet floors.  Keep items that you use a lot in easy-to-reach places.  If you need to reach something above you,  use a strong step stool that has a grab bar.  Keep electrical cords out of the way.  Do not use floor polish or wax that makes floors slippery. If you must use wax, use non-skid floor wax.  Do not have throw rugs and other things on the floor that can make you trip. What can I do with my stairs?  Do not leave any items on the stairs.  Make sure that there are handrails on both sides of the stairs and use them. Fix handrails that are broken or loose. Make sure that handrails are as long as the stairways.  Check any carpeting to make sure that it is firmly attached to the stairs. Fix any carpet that is loose or worn.  Avoid having throw rugs at the top or bottom of the stairs. If you do have throw rugs, attach them to the floor with carpet tape.  Make sure that you have a light switch at the top of the stairs and the bottom of the stairs. If you do not have them,  ask someone to add them for you. What else can I do to help prevent falls?  Wear shoes that:  Do not have high heels.  Have rubber bottoms.  Are comfortable and fit you well.  Are closed at the toe. Do not wear sandals.  If you use a stepladder:  Make sure that it is fully opened. Do not climb a closed stepladder.  Make sure that both sides of the stepladder are locked into place.  Ask someone to hold it for you, if possible.  Clearly mark and make sure that you can see:  Any grab bars or handrails.  First and last steps.  Where the edge of each step is.  Use tools that help you move around (mobility aids) if they are needed. These include:  Canes.  Walkers.  Scooters.  Crutches.  Turn on the lights when you go into a dark area. Replace any light bulbs as soon as they burn out.  Set up your furniture so you have a clear path. Avoid moving your furniture around.  If any of your floors are uneven, fix them.  If there are any pets around you, be aware of where they are.  Review your medicines with your doctor. Some medicines can make you feel dizzy. This can increase your chance of falling. Ask your doctor what other things that you can do to help prevent falls. This information is not intended to replace advice given to you by your health care provider. Make sure you discuss any questions you have with your health care provider. Document Released: 04/10/2009 Document Revised: 11/20/2015 Document Reviewed: 07/19/2014 Elsevier Interactive Patient Education  2017 [employer].

## 2021-03-10 DIAGNOSIS — B351 Tinea unguium: Secondary | ICD-10-CM | POA: Diagnosis not present

## 2021-03-10 DIAGNOSIS — Z85828 Personal history of other malignant neoplasm of skin: Secondary | ICD-10-CM | POA: Diagnosis not present

## 2021-03-10 DIAGNOSIS — L570 Actinic keratosis: Secondary | ICD-10-CM | POA: Diagnosis not present

## 2021-03-20 ENCOUNTER — Encounter: Payer: Self-pay | Admitting: Nurse Practitioner

## 2021-03-20 ENCOUNTER — Other Ambulatory Visit: Payer: Self-pay

## 2021-03-20 ENCOUNTER — Ambulatory Visit (INDEPENDENT_AMBULATORY_CARE_PROVIDER_SITE_OTHER): Payer: Medicare HMO | Admitting: Nurse Practitioner

## 2021-03-20 VITALS — BP 138/76 | HR 58 | Temp 97.6°F | Ht 64.0 in | Wt 164.4 lb

## 2021-03-20 DIAGNOSIS — K219 Gastro-esophageal reflux disease without esophagitis: Secondary | ICD-10-CM

## 2021-03-20 DIAGNOSIS — M8588 Other specified disorders of bone density and structure, other site: Secondary | ICD-10-CM

## 2021-03-20 DIAGNOSIS — E785 Hyperlipidemia, unspecified: Secondary | ICD-10-CM

## 2021-03-20 DIAGNOSIS — Z6827 Body mass index (BMI) 27.0-27.9, adult: Secondary | ICD-10-CM

## 2021-03-20 DIAGNOSIS — Z23 Encounter for immunization: Secondary | ICD-10-CM | POA: Diagnosis not present

## 2021-03-20 LAB — CBC WITH DIFFERENTIAL/PLATELET
Basophils Absolute: 0.1 10*3/uL (ref 0.0–0.2)
Basos: 1 %
EOS (ABSOLUTE): 0.2 10*3/uL (ref 0.0–0.4)
Eos: 3 %
Hematocrit: 43.4 % (ref 34.0–46.6)
Hemoglobin: 14.8 g/dL (ref 11.1–15.9)
Immature Grans (Abs): 0 10*3/uL (ref 0.0–0.1)
Immature Granulocytes: 0 %
Lymphocytes Absolute: 1.8 10*3/uL (ref 0.7–3.1)
Lymphs: 29 %
MCH: 31.6 pg (ref 26.6–33.0)
MCHC: 34.1 g/dL (ref 31.5–35.7)
MCV: 93 fL (ref 79–97)
Monocytes Absolute: 0.6 10*3/uL (ref 0.1–0.9)
Monocytes: 10 %
Neutrophils Absolute: 3.5 10*3/uL (ref 1.4–7.0)
Neutrophils: 57 %
Platelets: 276 10*3/uL (ref 150–450)
RBC: 4.68 x10E6/uL (ref 3.77–5.28)
RDW: 13.1 % (ref 11.7–15.4)
WBC: 6.2 10*3/uL (ref 3.4–10.8)

## 2021-03-20 LAB — CMP14+EGFR
ALT: 26 IU/L (ref 0–32)
AST: 32 IU/L (ref 0–40)
Albumin/Globulin Ratio: 2.1 (ref 1.2–2.2)
Albumin: 4.5 g/dL (ref 3.8–4.8)
Alkaline Phosphatase: 102 IU/L (ref 44–121)
BUN/Creatinine Ratio: 21 (ref 12–28)
BUN: 14 mg/dL (ref 8–27)
Bilirubin Total: 0.5 mg/dL (ref 0.0–1.2)
CO2: 25 mmol/L (ref 20–29)
Calcium: 9.4 mg/dL (ref 8.7–10.3)
Chloride: 103 mmol/L (ref 96–106)
Creatinine, Ser: 0.66 mg/dL (ref 0.57–1.00)
Globulin, Total: 2.1 g/dL (ref 1.5–4.5)
Glucose: 100 mg/dL — ABNORMAL HIGH (ref 65–99)
Potassium: 5.1 mmol/L (ref 3.5–5.2)
Sodium: 141 mmol/L (ref 134–144)
Total Protein: 6.6 g/dL (ref 6.0–8.5)
eGFR: 95 mL/min/{1.73_m2} (ref >59)

## 2021-03-20 LAB — LIPID PANEL
Chol/HDL Ratio: 3 ratio (ref 0.0–4.4)
Cholesterol, Total: 159 mg/dL (ref 100–199)
HDL: 53 mg/dL (ref >39)
LDL Chol Calc (NIH): 86 mg/dL (ref 0–99)
Triglycerides: 109 mg/dL (ref 0–149)
VLDL Cholesterol Cal: 20 mg/dL (ref 5–40)

## 2021-03-20 MED ORDER — ATORVASTATIN CALCIUM 40 MG PO TABS
40.0000 mg | ORAL_TABLET | Freq: Every day | ORAL | 1 refills | Status: DC
Start: 2021-03-20 — End: 2021-09-17

## 2021-03-20 NOTE — Patient Instructions (Signed)

## 2021-03-20 NOTE — Addendum Note (Signed)
Addended by: Karle Plumber on: 03/20/2021 10:43 AM   Modules accepted: Orders

## 2021-03-20 NOTE — Progress Notes (Signed)
Subjective:    Patient ID: Erica [name], female    DOB: April 04, 1951, 70 y.o.   MRN: [id number]  Chief Complaint: medical management of chronic issues     HPI:  1. Hyperlipidemia with target LDL less than 100 Does try to watch diet. Does exercise and stays very active. Lab Results  Component Value Date   CHOL 175 09/17/2020   HDL 54 09/17/2020   LDLCALC 98 09/17/2020   TRIG 130 09/17/2020   CHOLHDL 3.2 09/17/2020     2. Gastroesophageal reflux disease without esophagitis Only has occasional if she eat slate at night.  3. Osteopenia of lumbar spine Last dexascan was done on 03/18/20. Her t score was -2.0. she is on daily vitamin d and calcium supplement. Does do weight bearing exercises.  4. BMI 27.0-27.9,adult No recent weight changes Wt Readings from Last 3 Encounters:  03/20/21 164 lb 6.4 oz (74.6 kg)  09/17/20 163 lb (73.9 kg)  03/18/20 158 lb (71.7 kg)   BMI Readings from Last 3 Encounters:  03/20/21 28.22 kg/m  09/17/20 27.98 kg/m  03/18/20 27.12 kg/m   Checks blood pressure at home. Usually 893'T systolic BP Readings from Last 3 Encounters:  03/20/21 (!) 157/79  09/17/20 136/82  03/18/20 (!) 153/70     Outpatient Encounter Medications as of 03/20/2021  Medication Sig   aspirin EC 81 MG tablet Take 81 mg by mouth daily. Swallow whole.   atorvastatin (LIPITOR) 40 MG tablet Take 1 tablet (40 mg total) by mouth daily.   calcium carbonate (OSCAL) 1500 (600 Ca) MG TABS tablet Take by mouth daily with breakfast.   cholecalciferol (VITAMIN D3) 25 MCG (1000 UT) tablet Take 1,000 Units by mouth daily.   Coenzyme Q10 (CO Q-10) 200 MG CAPS Take by mouth.   KRILL OIL PO Take by mouth.   Multiple Vitamins-Minerals (CENTRUM SILVER 50+WOMEN PO) Take 1 capsule by mouth daily.   No facility-administered encounter medications on file as of 03/20/2021.    Past Surgical History:  Procedure Laterality Date   CHOLECYSTECTOMY N/A 05/14/2014   Procedure: LAPAROSCOPIC  CHOLECYSTECTOMY WITH INTRAOPERATIVE CHOLANGIOGRAM;  Surgeon: Gayland Curry, MD;  Location: [facility] OR;  Service: General;  Laterality: N/A;   NECK SURGERY     TONSILECTOMY/ADENOIDECTOMY WITH MYRINGOTOMY     TUBAL LIGATION      Family History  Problem Relation Age of Onset   Heart disease Father    Stroke Sister    Osteoporosis Mother    Heart attack Brother    Arthritis Daughter     New complaints: None today  Social history: Lives by herself- family checks on her daily  Controlled substance contract: n/a     Review of Systems  Constitutional:  Negative for diaphoresis.  Eyes:  Negative for pain.  Respiratory:  Negative for shortness of breath.   Cardiovascular:  Negative for chest pain, palpitations and leg swelling.  Gastrointestinal:  Negative for abdominal pain.  Endocrine: Negative for polydipsia.  Skin:  Negative for rash.  Neurological:  Negative for dizziness, weakness and headaches.  Hematological:  Does not bruise/bleed easily.  All other systems reviewed and are negative.     Objective:   Physical Exam Vitals and nursing note reviewed.  Constitutional:      General: She is not in acute distress.    Appearance: Normal appearance. She is well-developed.  HENT:     Head: Normocephalic.     Right Ear: Tympanic membrane normal.     Left Ear:  Tympanic membrane normal.     Nose: Nose normal.     Mouth/Throat:     Mouth: Mucous membranes are moist.  Eyes:     Pupils: Pupils are equal, round, and reactive to light.  Neck:     Vascular: No carotid bruit or JVD.  Cardiovascular:     Rate and Rhythm: Normal rate and regular rhythm.     Heart sounds: Normal heart sounds.  Pulmonary:     Effort: Pulmonary effort is normal. No respiratory distress.     Breath sounds: Normal breath sounds. No wheezing or rales.  Chest:     Chest wall: No tenderness.  Abdominal:     General: Bowel sounds are normal. There is no distension or abdominal bruit.     Palpations:  Abdomen is soft. There is no hepatomegaly, splenomegaly, mass or pulsatile mass.     Tenderness: There is no abdominal tenderness.  Musculoskeletal:        General: Normal range of motion.     Cervical back: Normal range of motion and neck supple.  Lymphadenopathy:     Cervical: No cervical adenopathy.  Skin:    General: Skin is warm and dry.  Neurological:     Mental Status: She is alert and oriented to person, place, and time.     Deep Tendon Reflexes: Reflexes are normal and symmetric.  Psychiatric:        Behavior: Behavior normal.        Thought Content: Thought content normal.        Judgment: Judgment normal.    BP 138/76   Pulse (!) 58   Temp 97.6 F (36.4 C) (Temporal)   Ht 5\' 4"  (1.626 m)   Wt 164 lb 6.4 oz (74.6 kg)   SpO2 96%   BMI 28.22 kg/m         Assessment & Plan:  [name] comes in today with chief complaint of Medical Management of Chronic Issues   Diagnosis and orders addressed:  1. Hyperlipidemia with target LDL less than 100 Low fat diet and exercise - atorvastatin (LIPITOR) 40 MG tablet; Take 1 tablet (40 mg total) by mouth daily.  Dispense: 90 tablet; Refill: 1  2. Gastroesophageal reflux disease without esophagitis Avoid spicy foods Do not eat 2 hours prior to bedtime  3. Osteopenia of lumbar spine Weight bearing exercises  4. BMI 27.0-27.9,adult Discussed diet and exercise for person with BMI >25 Will recheck weight in 3-6 months    Labs pending Health Maintenance reviewed Diet and exercise encouraged  Follow up plan: 6 months   Mary-Margaret Hassell Done, FNP

## 2021-03-30 DIAGNOSIS — Z1231 Encounter for screening mammogram for malignant neoplasm of breast: Secondary | ICD-10-CM | POA: Diagnosis not present

## 2021-08-11 DIAGNOSIS — Z0100 Encounter for examination of eyes and vision without abnormal findings: Secondary | ICD-10-CM | POA: Diagnosis not present

## 2021-08-11 DIAGNOSIS — E7800 Pure hypercholesterolemia, unspecified: Secondary | ICD-10-CM | POA: Diagnosis not present

## 2021-08-11 DIAGNOSIS — H5203 Hypermetropia, bilateral: Secondary | ICD-10-CM | POA: Diagnosis not present

## 2021-09-17 ENCOUNTER — Encounter: Payer: Self-pay | Admitting: Nurse Practitioner

## 2021-09-17 ENCOUNTER — Ambulatory Visit: Payer: Medicare HMO | Admitting: Nurse Practitioner

## 2021-09-17 ENCOUNTER — Ambulatory Visit (INDEPENDENT_AMBULATORY_CARE_PROVIDER_SITE_OTHER): Payer: Medicare HMO | Admitting: Nurse Practitioner

## 2021-09-17 VITALS — BP 135/67 | HR 70 | Temp 96.9°F | Resp 20 | Ht 64.0 in | Wt 171.0 lb

## 2021-09-17 DIAGNOSIS — Z6829 Body mass index (BMI) 29.0-29.9, adult: Secondary | ICD-10-CM | POA: Diagnosis not present

## 2021-09-17 DIAGNOSIS — M8588 Other specified disorders of bone density and structure, other site: Secondary | ICD-10-CM | POA: Diagnosis not present

## 2021-09-17 DIAGNOSIS — R0683 Snoring: Secondary | ICD-10-CM

## 2021-09-17 DIAGNOSIS — E785 Hyperlipidemia, unspecified: Secondary | ICD-10-CM

## 2021-09-17 DIAGNOSIS — K219 Gastro-esophageal reflux disease without esophagitis: Secondary | ICD-10-CM | POA: Diagnosis not present

## 2021-09-17 LAB — LIPID PANEL
Chol/HDL Ratio: 3.1 ratio (ref 0.0–4.4)
Cholesterol, Total: 165 mg/dL (ref 100–199)
HDL: 54 mg/dL (ref >39)
LDL Chol Calc (NIH): 94 mg/dL (ref 0–99)
Triglycerides: 91 mg/dL (ref 0–149)
VLDL Cholesterol Cal: 17 mg/dL (ref 5–40)

## 2021-09-17 LAB — CBC WITH DIFFERENTIAL/PLATELET
Basophils Absolute: 0 10*3/uL (ref 0.0–0.2)
Basos: 1 %
EOS (ABSOLUTE): 0.1 10*3/uL (ref 0.0–0.4)
Eos: 2 %
Hematocrit: 44.6 % (ref 34.0–46.6)
Hemoglobin: 15 g/dL (ref 11.1–15.9)
Immature Grans (Abs): 0 10*3/uL (ref 0.0–0.1)
Immature Granulocytes: 0 %
Lymphocytes Absolute: 1.8 10*3/uL (ref 0.7–3.1)
Lymphs: 28 %
MCH: 30.9 pg (ref 26.6–33.0)
MCHC: 33.6 g/dL (ref 31.5–35.7)
MCV: 92 fL (ref 79–97)
Monocytes Absolute: 0.6 10*3/uL (ref 0.1–0.9)
Monocytes: 9 %
Neutrophils Absolute: 3.8 10*3/uL (ref 1.4–7.0)
Neutrophils: 60 %
Platelets: 279 10*3/uL (ref 150–450)
RBC: 4.85 x10E6/uL (ref 3.77–5.28)
RDW: 13.1 % (ref 11.7–15.4)
WBC: 6.5 10*3/uL (ref 3.4–10.8)

## 2021-09-17 LAB — CMP14+EGFR
ALT: 26 IU/L (ref 0–32)
AST: 32 IU/L (ref 0–40)
Albumin/Globulin Ratio: 1.7 (ref 1.2–2.2)
Albumin: 4.2 g/dL (ref 3.8–4.8)
Alkaline Phosphatase: 113 IU/L (ref 44–121)
BUN/Creatinine Ratio: 15 (ref 12–28)
BUN: 13 mg/dL (ref 8–27)
Bilirubin Total: 0.4 mg/dL (ref 0.0–1.2)
CO2: 27 mmol/L (ref 20–29)
Calcium: 9.5 mg/dL (ref 8.7–10.3)
Chloride: 104 mmol/L (ref 96–106)
Creatinine, Ser: 0.84 mg/dL (ref 0.57–1.00)
Globulin, Total: 2.5 g/dL (ref 1.5–4.5)
Glucose: 103 mg/dL — ABNORMAL HIGH (ref 70–99)
Potassium: 5.4 mmol/L — ABNORMAL HIGH (ref 3.5–5.2)
Sodium: 141 mmol/L (ref 134–144)
Total Protein: 6.7 g/dL (ref 6.0–8.5)
eGFR: 75 mL/min/{1.73_m2} (ref >59)

## 2021-09-17 MED ORDER — ATORVASTATIN CALCIUM 40 MG PO TABS
40.0000 mg | ORAL_TABLET | Freq: Every day | ORAL | 1 refills | Status: DC
Start: 2021-09-17 — End: 2022-03-22

## 2021-09-17 NOTE — Progress Notes (Signed)
? ?Subjective:  ? ? Patient ID: Erica [name], female    DOB: 01-Aug-1950, 71 y.o.   MRN: [id number] ? ?Chief Complaint: medical management of chronic issues  ? ?HPI: ? ?Erica [name] is a 71 y.o. who identifies as a female who was assigned female at birth.  ? ?Social history: ?Lives with: by herself ?Work history: retired from [employer] ? ? ?Comes in today for follow up of the following chronic medical issues: ? ?1. Hyperlipidemia with target LDL less than 100 ?Does try to watch diet and stays very active ?Lab Results  ?Component Value Date  ? CHOL 159 03/20/2021  ? HDL 53 03/20/2021  ? Sarben 86 03/20/2021  ? TRIG 109 03/20/2021  ? CHOLHDL 3.0 03/20/2021  ?The 10-year ASCVD risk score (Arnett DK, et al., 2019) is: 9.9% ? ? ? ?2. Gastroesophageal reflux disease without esophagitis ?Is on no prescription meds and is doing well. ? ?3. Osteopenia of lumbar spine ?Last dexascan was done 03/18/20. Her t score was -2.0. she does doing some weight bearing exercising. ? ?4. BMI 27.0-27.9,adult ?Weight is up 7lbs ?Wt Readings from Last 3 Encounters:  ?09/17/21 171 lb (77.6 kg)  ?03/20/21 164 lb 6.4 oz (74.6 kg)  ?09/17/20 163 lb (73.9 kg)  ? ?BMI Readings from Last 3 Encounters:  ?09/17/21 29.35 kg/m?  ?03/20/21 28.22 kg/m?  ?09/17/20 27.98 kg/m?  ? ? ? ? ?New complaints: ?Snoring at night ? ?Allergies  ?Allergen Reactions  ? Povidone-Iodine Rash  ?  Experienced are betadine was left on an area for and extended period.  ? Statins   ?  Myalgias - Is now able to tolerate as long as she takees Co-Q-10  ? ?Outpatient Encounter Medications as of 09/17/2021  ?Medication Sig  ? aspirin EC 81 MG tablet Take 81 mg by mouth daily. Swallow whole.  ? atorvastatin (LIPITOR) 40 MG tablet Take 1 tablet (40 mg total) by mouth daily.  ? calcium carbonate (OSCAL) 1500 (600 Ca) MG TABS tablet Take by mouth daily with breakfast.  ? cholecalciferol (VITAMIN D3) 25 MCG (1000 UT) tablet Take 1,000 Units by mouth daily.  ? Coenzyme Q10 (CO Q-10) 200  MG CAPS Take by mouth.  ? KRILL OIL PO Take by mouth.  ? Multiple Vitamins-Minerals (CENTRUM SILVER 50+WOMEN PO) Take 1 capsule by mouth daily.  ? ?No facility-administered encounter medications on file as of 09/17/2021.  ? ? ?Past Surgical History:  ?Procedure Laterality Date  ? CHOLECYSTECTOMY N/A 05/14/2014  ? Procedure: LAPAROSCOPIC CHOLECYSTECTOMY WITH INTRAOPERATIVE CHOLANGIOGRAM;  Surgeon: Gayland Curry, MD;  Location: [facility];  Service: General;  Laterality: N/A;  ? NECK SURGERY    ? TONSILECTOMY/ADENOIDECTOMY WITH MYRINGOTOMY    ? TUBAL LIGATION    ? ? ?Family History  ?Problem Relation Age of Onset  ? Heart disease Father   ? Stroke Sister   ? Osteoporosis Mother   ? Heart attack Brother   ? Arthritis Daughter   ? ? ? ? ?Controlled substance contract: n/a ? ? ? ? ?Review of Systems  ?Constitutional:  Negative for diaphoresis.  ?Eyes:  Negative for pain.  ?Respiratory:  Negative for shortness of breath.   ?Cardiovascular:  Negative for chest pain, palpitations and leg swelling.  ?Gastrointestinal:  Negative for abdominal pain.  ?Endocrine: Negative for polydipsia.  ?Skin:  Negative for rash.  ?Neurological:  Negative for dizziness, weakness and headaches.  ?Hematological:  Does not bruise/bleed easily.  ?All other systems reviewed and are negative. ? ?   ?  Objective:  ? Physical Exam ?Vitals and nursing note reviewed.  ?Constitutional:   ?   General: She is not in acute distress. ?   Appearance: Normal appearance. She is well-developed.  ?HENT:  ?   Head: Normocephalic.  ?   Right Ear: Tympanic membrane normal.  ?   Left Ear: Tympanic membrane normal.  ?   Nose: Nose normal.  ?   Mouth/Throat:  ?   Mouth: Mucous membranes are moist.  ?Eyes:  ?   Pupils: Pupils are equal, round, and reactive to light.  ?Neck:  ?   Vascular: No carotid bruit or JVD.  ?Cardiovascular:  ?   Rate and Rhythm: Normal rate and regular rhythm.  ?   Heart sounds: Normal heart sounds.  ?Pulmonary:  ?   Effort: Pulmonary effort is normal.  No respiratory distress.  ?   Breath sounds: Normal breath sounds. No wheezing or rales.  ?Chest:  ?   Chest wall: No tenderness.  ?Abdominal:  ?   General: Bowel sounds are normal. There is no distension or abdominal bruit.  ?   Palpations: Abdomen is soft. There is no hepatomegaly, splenomegaly, mass or pulsatile mass.  ?   Tenderness: There is no abdominal tenderness.  ?Musculoskeletal:     ?   General: Normal range of motion.  ?   Cervical back: Normal range of motion and neck supple.  ?Lymphadenopathy:  ?   Cervical: No cervical adenopathy.  ?Skin: ?   General: Skin is warm and dry.  ?Neurological:  ?   Mental Status: She is alert and oriented to person, place, and time.  ?   Deep Tendon Reflexes: Reflexes are normal and symmetric.  ?Psychiatric:     ?   Behavior: Behavior normal.     ?   Thought Content: Thought content normal.     ?   Judgment: Judgment normal.  ? ? ?BP 135/67 Comment: home  Pulse 70   Temp (!) 96.9 ?F (36.1 ?C) (Temporal)   Resp 20   Ht 5' 4"  (1.626 m)   Wt 171 lb (77.6 kg)   SpO2 95%   BMI 29.35 kg/m?  ? ? ? ?   ?Assessment & Plan:  ?Erica [name] comes in today with chief complaint of Medical Management of Chronic Issues ? ? ?Diagnosis and orders addressed: ? ?1. Hyperlipidemia with target LDL less than 100 ?Low fat diet ?- atorvastatin (LIPITOR) 40 MG tablet; Take 1 tablet (40 mg total) by mouth daily.  Dispense: 90 tablet; Refill: 1 ?- CBC with Differential/Platelet ?- CMP14+EGFR ?- Lipid panel ? ?2. Gastroesophageal reflux disease without esophagitis ?Avoid spicy foods ?Do not eat 2 hours prior to bedtime ? ?3. Osteopenia of lumbar spine ?Weight bearing exercises encouraged ? ?4. BMI 29.0-29.9,adult ?Discussed diet and exercise for person with BMI >25 ?Will recheck weight in 3-6 months ? ? ?Ordered a sleep study for her snoring ?Labs pending ?Health Maintenance reviewed ?Diet and exercise encouraged ? ?Follow up plan: ?6 months ? ? ?Mary-Margaret Hassell Done, FNP ? ? ?

## 2021-09-17 NOTE — Patient Instructions (Signed)
Sleep Study, Adult ?A sleep study (polysomnogram) is a series of tests done while you are sleeping. A sleep study records your brain waves, heart rate, breathing rate, oxygen level, and eye and leg movements. ?A sleep study helps your health care provider: ?See how well you sleep. ?Diagnose a sleep disorder. ?Determine how severe your sleep disorder is. ?Create a plan to treat your sleep disorder. ?Your health care provider may recommend a sleep study if you: ?Feel sleepy on most days. ?Snore loudly while sleeping. ?Have unusual behaviors while you sleep, such as walking. ?Have brief periods in which you stop breathing during sleep (sleepapnea). ?Fall asleep suddenly during the day (narcolepsy). ?Have trouble falling asleep or staying asleep (insomnia). ?Feel like you need to move your legs when trying to fall asleep (restless legs syndrome). ?Move your legs by flexing and extending them regularly while asleep (periodic limb movement disorder). ?Act out your dreams while you sleep (sleep behavior disorder). ?Feel like you cannot move when you first wake up (sleep paralysis). ?What tests are part of a sleep study? ?Most sleep studies record the following during sleep: ?Brain activity. ?Eye movements. ?Heart rate and rhythm. ?Breathing rate and rhythm. ?Blood-oxygen level. ?Blood pressure. ?Chest and belly movement as you breathe. ?Arm and leg movements. ?Snoring or other noises. ?Body position. ?Where are sleep studies done? ?Sleep studies are done at sleep centers. A sleep center may be inside a hospital, office, or clinic. ?The room where you have the study may look like a hospital room or a hotel room. The health care providers doing the study may come in and out of the room during the study. Most of the time, they will be in another room monitoring your test as you sleep. ?How are sleep studies done? ?Most sleep studies are done during a normal period of time for a full night of sleep. You will arrive at the  study center in the evening and go home in the morning. ?Before the test ?Bring your pajamas and toothbrush with you to the sleep study. ?Do not have caffeine on the day of your sleep study. ?Do not drink alcohol on the day of your sleep study. ?Your health care provider will let you know if you should stop taking any of your regular medicines before the test. ?During the test ?  ?Round, sticky patches with sensors attached to recording wires (electrodes) are placed on your scalp, face, chest, and limbs. ?Wires from all the electrodes and sensors run from your bed to a computer. The wires can be taken off and put back on if you need to get out of bed to go to the bathroom. ?A sensor is placed over your nose to measure airflow. ?A finger clip is put on your finger or ear to measure your blood oxygen level (pulse oximetry). ?A belt is placed around your belly and a belt is placed around your chest to measure breathing movements. ?If you have signs of the sleep disorder called sleep apnea during your test, you may get a treatment mask to wear for the second half of the night. ?The mask provides positive airway pressure (PAP) to help you breathe better during sleep. This may greatly improve your sleep apnea. ?You will then have all tests done again with the mask in place to see if your measurements and recordings change. ?After the test ?A medical doctor who specializes in sleep will evaluate the results of your sleep study and share them with you and your primary health  care provider. ?Based on your results, your medical history, and a physical exam, you may be diagnosed with a sleep disorder, such as: ?Sleep apnea. ?Restless legs syndrome. ?Sleep-related behavior disorder. ?Sleep-related movement disorders. ?Sleep-related seizure disorders. ?Your health care team will help determine your treatment options based on your diagnosis. This may include: ?Improving your sleep habits (sleep hygiene). ?Wearing a continuous  positive airway pressure (CPAP) or bi-level positive airway pressure (BPAP) mask. ?Wearing an oral device at night to improve breathing and reduce snoring. ?Taking medicines. ?Follow these instructions at home: ?Take over-the-counter and prescription medicines only as told by your health care provider. ?If you are instructed to use a CPAP or BPAP mask, make sure you use it nightly as directed. ?Make any lifestyle changes that your health care provider recommends. ?If you were given a device to open your airway while you sleep, use it only as told by your health care provider. ?Do not use any tobacco products, such as cigarettes, chewing tobacco, and e-cigarettes. If you need help quitting, ask your health care provider. ?Keep all follow-up visits as told by your health care provider. This is important. ?Summary ?A sleep study (polysomnogram) is a series of tests done while you are sleeping. It shows how well you sleep. ?Most sleep studies are done over one full night of sleep. You will arrive at the study center in the evening and go home in the morning. ?If you have signs of the sleep disorder called sleep apnea during your test, you may get a treatment mask to wear for the second half of the night. ?A medical doctor who specializes in sleep will evaluate the results of your sleep study and share them with your primary health care provider. ?This information is not intended to replace advice given to you by your health care provider. Make sure you discuss any questions you have with your health care provider. ?Document Revised: 01/21/2021 Document Reviewed: 07/12/2017 ?Elsevier Patient Education ? [employer]. ? ?

## 2021-09-29 NOTE — Patient Instructions (Signed)
Erica [name] , ?Thank you for taking time to come for your Medicare Wellness Visit. I appreciate your ongoing commitment to your health goals. Please review the following plan we discussed and let me know if I can assist you in the future.  ? ?Screening recommendations/referrals: ?Colonoscopy: Done 05/16/2018 - Repeat in 5 years ?Mammogram: Done 03/30/2021 - Repeat annually ?Bone Density: Done 03/18/2020 - Repeat every 2 years  ?Recommended yearly ophthalmology/optometry visit for glaucoma screening and checkup ?Recommended yearly dental visit for hygiene and checkup ? ?Vaccinations: ?Influenza vaccine: Done 03/20/2021 - Repeat annually ?Pneumococcal vaccine: Done  03/10/2018 & 03/13/2019 ?Tdap vaccine: Done 01/26/2019 -Repeat in 10 years ?Shingles vaccine: Zostavax done 2012 - Shingrix #1 Done 03/20/2021 - due for Shingrix #2 at next visit   ?Covid-19:Done  08/03/2019, 08/31/2019, & 05/15/2020 ? ?Advanced directives: Please bring a copy of your health care power of attorney and living will to the office to be added to your chart at your convenience.  ? ?Conditions/risks identified: Keep up the great work! Aim for 30 minutes of exercise or brisk walking, 6-8 glasses of water, and 5 servings of fruits and vegetables each day. Call us if you don't hear any updates regarding your sleep test by the end of April. ? ?Next appointment: Follow up in one year for your annual wellness visit  ? ? ?Preventive Care 53 Years and Older, Female ?Preventive care refers to lifestyle choices and visits with your health care provider that can promote health and wellness. ?What does preventive care include? ?A yearly physical exam. This is also called an annual well check. ?Dental exams once or twice a year. ?Routine eye exams. Ask your health care provider how often you should have your eyes checked. ?Personal lifestyle choices, including: ?Daily care of your teeth and gums. ?Regular physical activity. ?Eating a healthy diet. ?Avoiding tobacco and  drug use. ?Limiting alcohol use. ?Practicing safe sex. ?Taking low-dose aspirin every day. ?Taking vitamin and mineral supplements as recommended by your health care provider. ?What happens during an annual well check? ?The services and screenings done by your health care provider during your annual well check will depend on your age, overall health, lifestyle risk factors, and family history of disease. ?Counseling  ?Your health care provider may ask you questions about your: ?Alcohol use. ?Tobacco use. ?Drug use. ?Emotional well-being. ?Home and relationship well-being. ?Sexual activity. ?Eating habits. ?History of falls. ?Memory and ability to understand (cognition). ?Work and work Statistician. ?Reproductive health. ?Screening  ?You may have the following tests or measurements: ?Height, weight, and BMI. ?Blood pressure. ?Lipid and cholesterol levels. These may be checked every 5 years, or more frequently if you are over 65 years old. ?Skin check. ?Lung cancer screening. You may have this screening every year starting at age 78 if you have a 30-pack-year history of smoking and currently smoke or have quit within the past 15 years. ?Fecal occult blood test (FOBT) of the stool. You may have this test every year starting at age 12. ?Flexible sigmoidoscopy or colonoscopy. You may have a sigmoidoscopy every 5 years or a colonoscopy every 10 years starting at age 44. ?Hepatitis C blood test. ?Hepatitis B blood test. ?Sexually transmitted disease (STD) testing. ?Diabetes screening. This is done by checking your blood sugar (glucose) after you have not eaten for a while (fasting). You may have this done every 1-3 years. ?Bone density scan. This is done to screen for osteoporosis. You may have this done starting at age 47. ?Mammogram. This may  be done every 1-2 years. Talk to your health care provider about how often you should have regular mammograms. ?Talk with your health care provider about your test results, treatment  options, and if necessary, the need for more tests. ?Vaccines  ?Your health care provider may recommend certain vaccines, such as: ?Influenza vaccine. This is recommended every year. ?Tetanus, diphtheria, and acellular pertussis (Tdap, Td) vaccine. You may need a Td booster every 10 years. ?Zoster vaccine. You may need this after age 75. ?Pneumococcal 13-valent conjugate (PCV13) vaccine. One dose is recommended after age 64. ?Pneumococcal polysaccharide (PPSV23) vaccine. One dose is recommended after age 49. ?Talk to your health care provider about which screenings and vaccines you need and how often you need them. ?This information is not intended to replace advice given to you by your health care provider. Make sure you discuss any questions you have with your health care provider. ?Document Released: 07/11/2015 Document Revised: 03/03/2016 Document Reviewed: 04/15/2015 ?Elsevier Interactive Patient Education ? 2017 [place]. ? ?Fall Prevention in the Home ?Falls can cause injuries. They can happen to people of all ages. There are many things you can do to make your home safe and to help prevent falls. ?What can I do on the outside of my home? ?Regularly fix the edges of walkways and driveways and fix any cracks. ?Remove anything that might make you trip as you walk through a door, such as a raised step or threshold. ?Trim any bushes or trees on the path to your home. ?Use bright outdoor lighting. ?Clear any walking paths of anything that might make someone trip, such as rocks or tools. ?Regularly check to see if handrails are loose or broken. Make sure that both sides of any steps have handrails. ?Any raised decks and porches should have guardrails on the edges. ?Have any leaves, snow, or ice cleared regularly. ?Use sand or salt on walking paths during winter. ?Clean up any spills in your garage right away. This includes oil or grease spills. ?What can I do in the bathroom? ?Use night lights. ?Install grab  bars by the toilet and in the tub and shower. Do not use towel bars as grab bars. ?Use non-skid mats or decals in the tub or shower. ?If you need to sit down in the shower, use a plastic, non-slip stool. ?Keep the floor dry. Clean up any water that spills on the floor as soon as it happens. ?Remove soap buildup in the tub or shower regularly. ?Attach bath mats securely with double-sided non-slip rug tape. ?Do not have throw rugs and other things on the floor that can make you trip. ?What can I do in the bedroom? ?Use night lights. ?Make sure that you have a light by your bed that is easy to reach. ?Do not use any sheets or blankets that are too big for your bed. They should not hang down onto the floor. ?Have a firm chair that has side arms. You can use this for support while you get dressed. ?Do not have throw rugs and other things on the floor that can make you trip. ?What can I do in the kitchen? ?Clean up any spills right away. ?Avoid walking on wet floors. ?Keep items that you use a lot in easy-to-reach places. ?If you need to reach something above you, use a strong step stool that has a grab bar. ?Keep electrical cords out of the way. ?Do not use floor polish or wax that makes floors slippery. If you must use  wax, use non-skid floor wax. ?Do not have throw rugs and other things on the floor that can make you trip. ?What can I do with my stairs? ?Do not leave any items on the stairs. ?Make sure that there are handrails on both sides of the stairs and use them. Fix handrails that are broken or loose. Make sure that handrails are as long as the stairways. ?Check any carpeting to make sure that it is firmly attached to the stairs. Fix any carpet that is loose or worn. ?Avoid having throw rugs at the top or bottom of the stairs. If you do have throw rugs, attach them to the floor with carpet tape. ?Make sure that you have a light switch at the top of the stairs and the bottom of the stairs. If you do not have them,  ask someone to add them for you. ?What else can I do to help prevent falls? ?Wear shoes that: ?Do not have high heels. ?Have rubber bottoms. ?Are comfortable and fit you well. ?Are closed at the toe.

## 2021-09-30 ENCOUNTER — Ambulatory Visit (INDEPENDENT_AMBULATORY_CARE_PROVIDER_SITE_OTHER): Payer: Medicare HMO

## 2021-09-30 VITALS — Wt 171.0 lb

## 2021-09-30 DIAGNOSIS — Z0000 Encounter for general adult medical examination without abnormal findings: Secondary | ICD-10-CM

## 2021-09-30 NOTE — Progress Notes (Signed)
? ?Subjective:  ? Erica [name] is a 71 y.o. female who presents for Medicare Annual (Subsequent) preventive examination. ? ?Virtual Visit via Telephone Note ? ?I connected with  [name] on 09/30/21 at  8:15 AM EDT by telephone and verified that I am speaking with the correct person using two identifiers. ? ?Location: ?Patient: Home ?Provider: [facility] ?Persons participating in the virtual visit: patient/Nurse Health Advisor ?  ?I discussed the limitations, risks, security and privacy concerns of performing an evaluation and management service by telephone and the availability of in person appointments. The patient expressed understanding and agreed to proceed. ? ?Interactive audio and video telecommunications were attempted between this nurse and patient, however failed, due to patient having technical difficulties OR patient did not have access to video capability.  We continued and completed visit with audio only. ? ?Some vital signs may be absent or patient reported.  ? ?[name], LPN  ? ?Review of Systems    ? ?Cardiac Risk Factors include: advanced age (>61mn, >>35women);dyslipidemia;smoking/ tobacco exposure ? ?   ?Objective:  ?  ?Today's Vitals  ? 09/30/21 0805  ?Weight: 171 lb (77.6 kg)  ? ?Body mass index is 29.35 kg/m?. ? ? ?  09/30/2021  ?  8:11 AM 09/29/2020  ?  8:21 AM 09/27/2019  ?  8:39 AM 12/20/2017  ?  2:54 PM 05/09/2014  ?  8:35 AM  ?Advanced Directives  ?Does Patient Have a Medical Advance Directive? Yes Yes Yes Yes No  ?Type of [place] will [place];Out of facility DNR (pink MOST or yellow form)    ?Copy of [place] Chart? No - copy requested No - copy requested     ?Would patient like information on creating a medical advance directive?     Yes - Educational materials given  ? ? ?Current Medications (verified) ?Outpatient Encounter Medications as of  09/30/2021  ?Medication Sig  ? atorvastatin (LIPITOR) 40 MG tablet Take 1 tablet (40 mg total) by mouth daily.  ? calcium carbonate (OSCAL) 1500 (600 Ca) MG TABS tablet Take by mouth daily with breakfast.  ? cholecalciferol (VITAMIN D3) 25 MCG (1000 UT) tablet Take 1,000 Units by mouth daily.  ? Coenzyme Q10 (CO Q-10) 200 MG CAPS Take by mouth.  ? Multiple Vitamins-Minerals (CENTRUM SILVER 50+WOMEN PO) Take 1 capsule by mouth daily.  ? ?No facility-administered encounter medications on file as of 09/30/2021.  ? ? ?Allergies (verified) ?Povidone-iodine and Statins  ? ?History: ?Past Medical History:  ?Diagnosis Date  ? Complication of anesthesia   ? Hyperlipidemia   ? Osteopenia   ? ?Past Surgical History:  ?Procedure Laterality Date  ? CHOLECYSTECTOMY N/A 05/14/2014  ? Procedure: LAPAROSCOPIC CHOLECYSTECTOMY WITH INTRAOPERATIVE CHOLANGIOGRAM;  Surgeon: EGayland Curry MD;  Location[facility]  Service: General;  Laterality: N/A;  ? NECK SURGERY    ? TONSILECTOMY/ADENOIDECTOMY WITH MYRINGOTOMY    ? TUBAL LIGATION    ? ?Family History  ?Problem Relation Age of Onset  ? Heart disease Father   ? Stroke Sister   ? Osteoporosis Mother   ? Heart attack Brother   ? Arthritis Daughter   ? ?Social History  ? ?Socioeconomic History  ? Marital status: Divorced  ?  Spouse name: Not on file  ? Number of children: 3  ? Years of education: ged  ? Highest education level: GED or equivalent  ?Occupational History  ?  Occupation: Charity fundraiser  ?  Comment: retired  ?Tobacco Use  ? Smoking status: Former  ?  Packs/day: 1.00  ?  Years: 40.00  ?  Pack years: 40.00  ?  Types: Cigarettes  ?  Quit date: 06/29/2007  ?  Years since quitting: 14.2  ? Smokeless tobacco: Never  ?Vaping Use  ? Vaping Use: Never used  ?Substance and Sexual Activity  ? Alcohol use: Yes  ?  Alcohol/week: 1.0 standard drink  ?  Types: 1 Glasses of wine per week  ?  Comment: rarely  ? Drug use: No  ? Sexual activity: Not on file  ?Other Topics Concern  ? Not on file  ?Social History  Narrative  ? 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 She is divorced and lives alone. She has 2 daughters and 1 son. She sees her children regularly. She enjoys golf and gardening.   ? ?Social Determinants of Health  ? ?Financial Resource Strain: Low Risk   ? Difficulty of Paying Living Expenses: Not hard at all  ?Food Insecurity: No Food Insecurity  ? Worried About Charity fundraiser in the Last Year: Never true  ? Ran Out of Food in the Last Year: Never true  ?Transportation Needs: No Transportation Needs  ? Lack of Transportation (Medical): No  ? Lack of Transportation (Non-Medical): No  ?Physical Activity: Sufficiently Active  ? Days of Exercise per Week: 6 days  ? Minutes of Exercise per Session: 60 min  ?Stress: No Stress Concern Present  ? Feeling of Stress : Not at all  ?Social Connections: Socially Isolated  ? Frequency of Communication with Friends and Family: More than three times a week  ? Frequency of Social Gatherings with Friends and Family: Three times a week  ? Attends Religious Services: Never  ? Active Member of Clubs or Organizations: No  ? Attends Archivist Meetings: Never  ? Marital Status: Divorced  ? ? ?Tobacco Counseling ?Counseling given: Not Answered ? ? ?Clinical Intake: ? ?Pre-visit preparation completed: Yes ? ?Pain : No/denies pain ? ?  ? ?BMI - recorded: 29.35 ?Nutritional Status: BMI 25 -29 Overweight ?Nutritional Risks: None ?Diabetes: No ? ?How often do you need to have someone help you when you read instructions, pamphlets, or other written materials from your doctor or pharmacy?: 1 - Never ? ?Diabetic? no ? ?Interpreter Needed?: No ? ?Information entered by :: Shakiya Mcneary, LPN ? ? ?Activities of Daily Living ? ?  09/30/2021  ?  8:11 AM  ?In your present state of health, do you have any difficulty performing the following activities:  ?Hearing? 0  ?Vision? 0  ?Difficulty concentrating or making decisions? 0  ?Walking or climbing stairs? 0  ?Dressing or bathing? 0  ?Doing errands,  shopping? 0  ?Preparing Food and eating ? N  ?Using the Toilet? N  ?In the past six months, have you accidently leaked urine? Y  ?Comment wears pantyliners for protection - mild  ?Do you have problems with loss of bowel control? N  ?Managing your Medications? N  ?Managing your Finances? N  ?Housekeeping or managing your Housekeeping? N  ? ? ?Patient Care Team: ?Chevis Pretty, FNP as PCP - General (Nurse Practitioner) ?Sandford Craze, MD as Referring Physician (Dermatology) ?Celestia Khat, OD (Optometry) ? ?Indicate any recent Medical Services you may have received from other than [facility] providers in the past year (date may be approximate). ? ?   ?Assessment:  ? This is a routine wellness examination for Brucha. ? ?  Hearing/Vision screen ?Hearing Screening - Comments:: Denies hearing difficulties   ?Vision Screening - Comments:: Wears rx glasses - up to date with routine eye exams with [facility] ? ?Dietary issues and exercise activities discussed: ?Current Exercise Habits: Home exercise routine, Type of exercise: walking;Other - see comments (golfing), Time (Minutes): 60, Frequency (Times/Week): 6, Weekly Exercise (Minutes/Week): 360, Intensity: Mild, Exercise limited by: None identified ? ? Goals Addressed   ? ?  ?  ?  ?  ? This Visit's Progress  ?  Exercise 3x per week (30 min per time)   On track  ?  Continue walking at least 30 minutes daily ?  ?  Patient Stated     ?  09/30/2021 ?AWV Goal: Exercise for General Health and maintain independence - lose a little weight ? ?Patient will verbalize understanding of the benefits of increased physical activity: ?Exercising regularly is important. It will improve your overall fitness, flexibility, and endurance. ?Regular exercise also will improve your overall health. It can help you control your weight, reduce stress, and improve your bone density. ?Over the next year, patient will increase physical activity as tolerated with a goal of at least 150 minutes of  moderate physical activity per week.  ?You can tell that you are exercising at a moderate intensity if your heart starts beating faster and you start breathing faster but can still hold a conversation.

## 2022-01-04 DIAGNOSIS — R040 Epistaxis: Secondary | ICD-10-CM | POA: Diagnosis not present

## 2022-01-05 DIAGNOSIS — R040 Epistaxis: Secondary | ICD-10-CM | POA: Diagnosis not present

## 2022-01-11 ENCOUNTER — Ambulatory Visit (INDEPENDENT_AMBULATORY_CARE_PROVIDER_SITE_OTHER): Payer: Medicare HMO | Admitting: Nurse Practitioner

## 2022-01-11 ENCOUNTER — Encounter: Payer: Self-pay | Admitting: Nurse Practitioner

## 2022-01-11 VITALS — BP 133/79 | HR 66 | Temp 97.5°F | Resp 20 | Ht 64.0 in | Wt 164.0 lb

## 2022-01-11 DIAGNOSIS — R040 Epistaxis: Secondary | ICD-10-CM | POA: Diagnosis not present

## 2022-01-11 DIAGNOSIS — H6982 Other specified disorders of Eustachian tube, left ear: Secondary | ICD-10-CM

## 2022-01-11 DIAGNOSIS — R42 Dizziness and giddiness: Secondary | ICD-10-CM

## 2022-01-11 MED ORDER — FLUTICASONE PROPIONATE 50 MCG/ACT NA SUSP: 2.0000 | Freq: Every day | NASAL | 6 refills | Status: DC

## 2022-01-11 MED ORDER — MECLIZINE HCL 25 MG PO TABS: 25.0000 mg | ORAL_TABLET | Freq: Three times a day (TID) | ORAL | 0 refills | Status: DC | PRN

## 2022-01-11 NOTE — Patient Instructions (Signed)
Nosebleed, Adult ?A nosebleed is when blood comes out of the nose. Nosebleeds are common and can be caused by many things. They are usually not a sign of a serious medical problem. ?Follow these instructions at home: ?When you have a nosebleed: ? ?Sit down. ?Tilt your head forward a little. ?Follow these steps: ?Pinch your nose with a clean towel or tissue. ?Keep pinching your nose for 5 minutes. Do not let go. ?After 5 minutes, let go of your nose. ?Keep doing these steps until the bleeding stops. ?Do not put tissues or other things in your nose to stop the bleeding. ?Avoid lying down or putting your head back. ?Use a nose spray decongestant as told by your doctor. ?After a nosebleed: ?Try not to blow your nose or sniffle for several hours. ?Try not to strain, lift, or bend at the waist for several days. ?Aspirin and medicines that thin your blood make bleeding more likely. If you take these medicines: ?Ask your doctor if you should stop taking them or if you should change how much you take. ?Do not stop taking the medicine unless your doctor tells you to. ?If your nosebleed was caused by dryness, use over-the-counter saline nasal spray or gel and a humidifier as told by your doctor. This will keep the inside of your nose moist and allow it to heal. If you need to use nasal spray or gel: ?Choose one that is water-soluble. ?Use only as much as you need and use it only as often as needed. ?Do not lie down right away after you use it. ?If you get nosebleeds often, talk with your doctor about treatments. These may include: ?Nasal cautery. A chemical swab or electrical device is used to lightly burn tiny blood vessels inside the nose. This helps stop or prevent nosebleeds. ?Nasal packing. A gauze or other material is placed in the nose to keep constant pressure on the bleeding area. ?Contact a doctor if: ?You have a fever. ?You get nosebleeds often. ?You get nosebleeds more often than usual. ?You bruise very  easily. ?You have something stuck in your nose. ?You are bleeding in your mouth. ?You vomit or cough up brown material. ?You get a nosebleed after you start a new medicine. ?Get help right away if: ?You have a nosebleed after you fall or hurt your head. ?Your nosebleed does not go away after 20 minutes. ?You feel dizzy or weak. ?You have unusual bleeding from other parts of your body. ?You have unusual bruising on other parts of your body. ?You get sweaty. ?You vomit blood. ?Summary ?Nosebleeds are common. They are usually not a sign of a serious medical problem. ?When you have a nosebleed, sit down and tilt your head a little forward. Pinch your nose with a clean tissue for 5 minutes. ?Use saline spray or saline gel and a humidifier as told by your doctor. ?Get help right away if your nosebleed does not go away after 20 minutes. ?This information is not intended to replace advice given to you by your health care provider. Make sure you discuss any questions you have with your health care provider. ?Document Revised: 06/23/2021 Document Reviewed: 06/23/2021 ?Elsevier Patient Education ? 2023 [employer] ? ?

## 2022-01-11 NOTE — Progress Notes (Signed)
Subjective:    Patient ID: Erica [name], female    DOB: 02/06/1951, 71 y.o.   MRN: [id number]   Chief Complaint: Nose bleeds (2 weeks ago/) and Dizziness   Dizziness Pertinent negatives include no abdominal pain, chest pain, diaphoresis, headaches, rash or weakness.   Patient has been having daily nose bleeds for several weeks. She wnet o urgent care one day and they stopped the bleed and suggested that she go see ENT. SHe went  to see EMT last Thursday and they did not see anything in there. They suggested she take it easy. She had one nose bleed over the weekend. She is afraid to blow her nose , because she is afraid it will bleed. 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 Sunday she became very dizzy and had to lay down. She says she hears a roaring noise in her left ear.     Review of Systems  Constitutional:  Negative for diaphoresis.  HENT:  Negative for ear discharge, ear pain and sinus pressure.   Eyes:  Negative for pain.  Respiratory:  Negative for shortness of breath.   Cardiovascular:  Negative for chest pain, palpitations and leg swelling.  Gastrointestinal:  Negative for abdominal pain.  Endocrine: Negative for polydipsia.  Skin:  Negative for rash.  Neurological:  Positive for dizziness. Negative for weakness and headaches.  Hematological:  Does not bruise/bleed easily.  All other systems reviewed and are negative.      Objective:   Physical Exam Constitutional:      Appearance: Normal appearance.  HENT:     Right Ear: Tympanic membrane normal.     Left Ear: Left ear bulging TM: light.     Nose: No nasal deformity, septal deviation, signs of injury, laceration, nasal tenderness, mucosal edema, congestion or rhinorrhea.     Left Nostril: No epistaxis or septal hematoma.     Left Turbinates: Not enlarged.     Left Sinus: Frontal sinus tenderness present. No maxillary sinus tenderness.  Cardiovascular:     Rate and Rhythm: Normal rate and regular  rhythm.     Heart sounds: Normal heart sounds.  Pulmonary:     Effort: Pulmonary effort is normal.     Breath sounds: Normal breath sounds.  Skin:    General: Skin is warm.  Neurological:     General: No focal deficit present.     Mental Status: She is alert and oriented to person, place, and time.  Psychiatric:        Mood and Affect: Mood normal.        Behavior: Behavior normal.    BP 133/79   Pulse 66   Temp (!) 97.5 F (36.4 C) (Temporal)   Resp 20   Ht '5\' 4"'$  (1.626 m)   Wt 164 lb (74.4 kg)   SpO2 96%   BMI 28.15 kg/m         Assessment & Plan:   Erica [name] in today with chief complaint of Nose bleeds (2 weeks ago/) and Dizziness   1. Chronic dysfunction of left eustachian tube Lightly sniff when using flonase - fluticasone (FLONASE) 50 MCG/ACT nasal spray; Place 2 sprays into both nostrils daily.  Dispense: 16 g; Refill: 6  2. Vertigo Meclizine as needed - meclizine (ANTIVERT) 25 MG tablet; Take 1 tablet (25 mg total) by mouth 3 (three) times daily as needed for dizziness.  Dispense: 30 tablet; Refill: 0  3. Bleeding from the nose Use Vaseline  in nose nightly Run humidifier Do not pick nose Return to ENT if continues    The above assessment and management plan was discussed with the patient. The patient verbalized understanding of and has agreed to the management plan. Patient is aware to call the clinic if symptoms persist or worsen. Patient is aware when to return to the clinic for a follow-up visit. Patient educated on when it is appropriate to go to the emergency department.   Mary-Margaret Hassell Done, FNP

## 2022-01-14 ENCOUNTER — Ambulatory Visit: Payer: Medicare HMO | Admitting: Nurse Practitioner

## 2022-03-08 DIAGNOSIS — Z85828 Personal history of other malignant neoplasm of skin: Secondary | ICD-10-CM | POA: Diagnosis not present

## 2022-03-08 DIAGNOSIS — D239 Other benign neoplasm of skin, unspecified: Secondary | ICD-10-CM | POA: Diagnosis not present

## 2022-03-08 DIAGNOSIS — Z1283 Encounter for screening for malignant neoplasm of skin: Secondary | ICD-10-CM | POA: Diagnosis not present

## 2022-03-22 ENCOUNTER — Ambulatory Visit (INDEPENDENT_AMBULATORY_CARE_PROVIDER_SITE_OTHER): Payer: Medicare HMO

## 2022-03-22 ENCOUNTER — Encounter: Payer: Self-pay | Admitting: Nurse Practitioner

## 2022-03-22 ENCOUNTER — Ambulatory Visit (INDEPENDENT_AMBULATORY_CARE_PROVIDER_SITE_OTHER): Payer: Medicare HMO | Admitting: Nurse Practitioner

## 2022-03-22 VITALS — BP 154/83 | HR 67 | Temp 97.6°F | Ht 64.0 in | Wt 167.4 lb

## 2022-03-22 DIAGNOSIS — M8588 Other specified disorders of bone density and structure, other site: Secondary | ICD-10-CM

## 2022-03-22 DIAGNOSIS — Z6827 Body mass index (BMI) 27.0-27.9, adult: Secondary | ICD-10-CM

## 2022-03-22 DIAGNOSIS — K219 Gastro-esophageal reflux disease without esophagitis: Secondary | ICD-10-CM

## 2022-03-22 DIAGNOSIS — Z23 Encounter for immunization: Secondary | ICD-10-CM

## 2022-03-22 DIAGNOSIS — E785 Hyperlipidemia, unspecified: Secondary | ICD-10-CM | POA: Diagnosis not present

## 2022-03-22 LAB — CBC WITH DIFFERENTIAL/PLATELET
Basophils Absolute: 0.1 10*3/uL (ref 0.0–0.2)
Basos: 1 %
EOS (ABSOLUTE): 0.2 10*3/uL (ref 0.0–0.4)
Eos: 3 %
Hematocrit: 44.5 % (ref 34.0–46.6)
Hemoglobin: 15 g/dL (ref 11.1–15.9)
Immature Grans (Abs): 0 10*3/uL (ref 0.0–0.1)
Immature Granulocytes: 0 %
Lymphocytes Absolute: 2.3 10*3/uL (ref 0.7–3.1)
Lymphs: 29 %
MCH: 31.1 pg (ref 26.6–33.0)
MCHC: 33.7 g/dL (ref 31.5–35.7)
MCV: 92 fL (ref 79–97)
Monocytes Absolute: 0.8 10*3/uL (ref 0.1–0.9)
Monocytes: 10 %
Neutrophils Absolute: 4.6 10*3/uL (ref 1.4–7.0)
Neutrophils: 57 %
Platelets: 273 10*3/uL (ref 150–450)
RBC: 4.82 x10E6/uL (ref 3.77–5.28)
RDW: 14.2 % (ref 11.7–15.4)
WBC: 7.9 10*3/uL (ref 3.4–10.8)

## 2022-03-22 LAB — CMP14+EGFR
ALT: 23 IU/L (ref 0–32)
AST: 33 IU/L (ref 0–40)
Albumin/Globulin Ratio: 1.8 (ref 1.2–2.2)
Albumin: 4.4 g/dL (ref 3.9–4.9)
Alkaline Phosphatase: 106 IU/L (ref 44–121)
BUN/Creatinine Ratio: 21 (ref 12–28)
BUN: 14 mg/dL (ref 8–27)
Bilirubin Total: 0.5 mg/dL (ref 0.0–1.2)
CO2: 25 mmol/L (ref 20–29)
Calcium: 9.8 mg/dL (ref 8.7–10.3)
Chloride: 103 mmol/L (ref 96–106)
Creatinine, Ser: 0.67 mg/dL (ref 0.57–1.00)
Globulin, Total: 2.4 g/dL (ref 1.5–4.5)
Glucose: 94 mg/dL (ref 70–99)
Potassium: 5.4 mmol/L — ABNORMAL HIGH (ref 3.5–5.2)
Sodium: 141 mmol/L (ref 134–144)
Total Protein: 6.8 g/dL (ref 6.0–8.5)
eGFR: 94 mL/min/{1.73_m2} (ref >59)

## 2022-03-22 LAB — LIPID PANEL
Chol/HDL Ratio: 3.2 ratio (ref 0.0–4.4)
Cholesterol, Total: 167 mg/dL (ref 100–199)
HDL: 52 mg/dL (ref >39)
LDL Chol Calc (NIH): 91 mg/dL (ref 0–99)
Triglycerides: 136 mg/dL (ref 0–149)
VLDL Cholesterol Cal: 24 mg/dL (ref 5–40)

## 2022-03-22 MED ORDER — ATORVASTATIN CALCIUM 40 MG PO TABS: 40.0000 mg | ORAL_TABLET | Freq: Every day | ORAL | 1 refills | Status: DC

## 2022-03-22 NOTE — Patient Instructions (Signed)
Bone Health Bones protect organs, store calcium, anchor muscles, and support the whole body. Keeping your bones strong is important, especially as you get older. You can take actions to help keep your bones strong and healthy. Why is keeping my bones healthy important?  Keeping your bones healthy is important because your body constantly replaces bone cells. Cells get old, and new cells take their place. As we age, we lose bone cells because the body may not be able to make enough new cells to replace the old cells. The amount of bone cells and bone tissue you have is referred to as bone mass. The higher your bone mass, the stronger your bones. The aging process leads to an overall loss of bone mass in the body, which can increase the likelihood of: Broken bones. A condition in which the bones become weak and brittle (osteoporosis). A large decline in bone mass occurs in older adults. In women, it occurs about the time of menopause. What actions can I take to keep my bones healthy? Good health habits are important for maintaining healthy bones. This includes eating nutritious foods and exercising regularly. To have healthy bones, you need to get enough of the right minerals and vitamins. Most nutrition experts recommend getting these nutrients from the foods that you eat. In some cases, taking supplements may also be recommended. Doing certain types of exercise is also important for bone health. What are the nutritional recommendations for healthy bones?  Eating a well-balanced diet with plenty of calcium and vitamin D will help to protect your bones. Nutritional recommendations vary from person to person. Ask your health care provider what is healthy for you. Here are some general guidelines. Get enough calcium Calcium is the most important (essential) mineral for bone health. Most people can get enough calcium from their diet, but supplements may be recommended for people who are at risk for  osteoporosis. Good sources of calcium include: Dairy products, such as low-fat or nonfat milk, cheese, and yogurt. Dark green leafy vegetables, such as bok choy and broccoli. Foods that have calcium added to them (are fortified). Foods that may be fortified with calcium include orange juice, cereal, bread, soy beverages, and tofu products. Nuts, such as almonds. Follow these recommended amounts for daily calcium intake: Infants, 0-6 months: 200 mg. Infants, 6-12 months: 260 mg. Children, age 1-3: 700 mg. Children, age 4-8: 1,000 mg. Children, age 9-13: 1,300 mg. Teens, age 14-18: 1,300 mg. Adults, age 19-50: 1,000 mg. Adults, age 51-70: Men: 1,000 mg. Women: 1,200 mg. Adults, age 71 or older: 1,200 mg. Pregnant and breastfeeding females: Teens: 1,300 mg. Adults: 1,000 mg. Get enough vitamin D Vitamin D is the most essential vitamin for bone health. It helps the body absorb calcium. Sunlight stimulates the skin to make vitamin D, so be sure to get enough sunlight. If you live in a cold climate or you do not get outside often, your health care provider may recommend that you take vitamin D supplements. Good sources of vitamin D in your diet include: Egg yolks. Saltwater fish. Milk and cereal fortified with vitamin D. Follow these recommended amounts for daily vitamin D intake: Infants, 0-12 months: 400 international units (IU). Children and teens, age 1-18: 600 international units. Adults, age 59 or younger: 600 international units. Adults, age 60 or older: 600-1,000 international units. Get other important nutrients Other nutrients that are important for bone health include: Phosphorus. This mineral is found in meat, poultry, dairy foods, nuts, and legumes. The   recommended daily intake for adult men and adult women is 700 mg. Magnesium. This mineral is found in seeds, nuts, dark green vegetables, and legumes. The recommended daily intake for adult men is 400-420 mg. For adult women,  it is 310-320 mg. Vitamin K. This vitamin is found in green leafy vegetables. The recommended daily intake is 120 mcg for adult men and 90 mcg for adult women. What type of physical activity is best for building and maintaining healthy bones? Weight-bearing and strength-building activities are important for building and maintaining healthy bones. Weight-bearing activities cause muscles and bones to work against gravity. Strength-building activities increase the strength of the muscles that support bones. Weight-bearing and muscle-building activities include: Walking and hiking. Jogging and running. Dancing. Gym exercises. Lifting weights. Tennis and racquetball. Climbing stairs. Aerobics. Adults should get at least 30 minutes of moderate physical activity on most days. Children should get at least 60 minutes of moderate physical activity on most days. Ask your health care provider what type of exercise is best for you. How can I find out if my bone mass is low? Bone mass can be measured with an X-ray test called a bone mineral density (BMD) test. This test is recommended for all women who are age 65 or older. It may also be recommended for: Men who are age 70 or older. People who are at risk for osteoporosis because of: Having a long-term disease that weakens bones, such as kidney disease or rheumatoid arthritis. Having menopause earlier than normal. Taking medicine that weakens bones, such as steroids, thyroid hormones, or hormone treatment for breast cancer or prostate cancer. Smoking. Drinking three or more alcoholic drinks a day. Being underweight. Sedentary lifestyle. If you find that you have a low bone mass, you may be able to prevent osteoporosis or further bone loss by changing your diet and lifestyle. Where can I find more information? Bone Health & Osteoporosis Foundation: www.nof.org/patients National Institutes of Health: www.bones.nih.gov International Osteoporosis  Foundation: www.iofbonehealth.org Summary The aging process leads to an overall loss of bone mass in the body, which can increase the likelihood of broken bones and osteoporosis. Eating a well-balanced diet with plenty of calcium and vitamin D will help to protect your bones. Weight-bearing and strength-building activities are also important for building and maintaining strong bones. Bone mass can be measured with an X-ray test called a bone mineral density (BMD) test. This information is not intended to replace advice given to you by your health care provider. Make sure you discuss any questions you have with your health care provider. Document Revised: 11/26/2020 Document Reviewed: 11/26/2020 Elsevier Patient Education  2023 Elsevier Inc.  

## 2022-03-22 NOTE — Progress Notes (Signed)
Subjective:    Patient ID: Erica [name], female    DOB: 1950-09-06, 71 y.o.   MRN: [id number]   Chief Complaint: Medical Management of Chronic Issues    HPI:  Erica [name] is a 71 y.o. who identifies as a female who was assigned female at birth.   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 Hyperlipidemia with target LDL less than 100 Tries to watch diet and has not been doing any exercise. She does play golf 2-3 x a week. Lab Results  Component Value Date   CHOL 165 09/17/2021   HDL 54 09/17/2021   LDLCALC 94 09/17/2021   TRIG 91 09/17/2021   CHOLHDL 3.1 09/17/2021   The 10-year ASCVD risk score (Arnett DK, et al., 2019) is: 12.7%   2. Gastroesophageal reflux disease without esophagitis Has been doing well. Is on no prescription meds  3. Osteopenia of lumbar spine Last dexascan was done 03/18/20. Her t score was -2.0. we will repeat it today  4. BMI 27.0-27.9,adult No recent weight changes Wt Readings from Last 3 Encounters:  03/22/22 167 lb 6.4 oz (75.9 kg)  01/11/22 164 lb (74.4 kg)  09/30/21 171 lb (77.6 kg)   BMI Readings from Last 3 Encounters:  03/22/22 28.73 kg/m  01/11/22 28.15 kg/m  09/30/21 29.35 kg/m   Blood pressure at home runs in 329'J systolic   New complaints: None today  Allergies  Allergen Reactions   Povidone-Iodine Rash    Experienced are betadine was left on an area for and extended period.   Statins     Myalgias - Is now able to tolerate as long as she takees Co-Q-10   Outpatient Encounter Medications as of 03/22/2022  Medication Sig   atorvastatin (LIPITOR) 40 MG tablet Take 1 tablet (40 mg total) by mouth daily.   calcium carbonate (OSCAL) 1500 (600 Ca) MG TABS tablet Take by mouth daily with breakfast.   cholecalciferol (VITAMIN D3) 25 MCG (1000 UT) tablet Take 1,000 Units by mouth daily.    Coenzyme Q10 (CO Q-10) 200 MG CAPS Take by mouth.   fluticasone (FLONASE) 50 MCG/ACT nasal spray Place 2 sprays into both nostrils daily.   meclizine (ANTIVERT) 25 MG tablet Take 1 tablet (25 mg total) by mouth 3 (three) times daily as needed for dizziness.   Multiple Vitamins-Minerals (CENTRUM SILVER 50+WOMEN PO) Take 1 capsule by mouth daily.   No facility-administered encounter medications on file as of 03/22/2022.    Past Surgical History:  Procedure Laterality Date   CHOLECYSTECTOMY N/A 05/14/2014   Procedure: LAPAROSCOPIC CHOLECYSTECTOMY WITH INTRAOPERATIVE CHOLANGIOGRAM;  Surgeon: Gayland Curry, MD;  Location: [facility];  Service: General;  Laterality: N/A;   NECK SURGERY     TONSILECTOMY/ADENOIDECTOMY WITH MYRINGOTOMY     TUBAL LIGATION      Family History  Problem Relation Age of Onset   Heart disease Father    Stroke Sister    Osteoporosis Mother    Heart attack Brother    Arthritis Daughter       Controlled substance contract: n/a     Review of Systems  Constitutional:  Negative for diaphoresis.  Eyes:  Negative for pain.  Respiratory:  Negative for shortness of breath.   Cardiovascular:  Negative for chest pain, palpitations and leg swelling.  Gastrointestinal:  Negative for abdominal pain.  Endocrine: Negative for  polydipsia.  Skin:  Negative for rash.  Neurological:  Negative for dizziness, weakness and headaches.  Hematological:  Does not bruise/bleed easily.  All other systems reviewed and are negative.      Objective:   Physical Exam Vitals and nursing note reviewed.  Constitutional:      General: She is not in acute distress.    Appearance: Normal appearance. She is well-developed.  HENT:     Head: Normocephalic.     Right Ear: Tympanic membrane normal.     Left Ear: Tympanic membrane normal.     Nose: Nose normal.     Mouth/Throat:     Mouth: Mucous membranes are moist.  Eyes:     Pupils: Pupils are equal, round, and reactive to light.   Neck:     Vascular: No carotid bruit or JVD.  Cardiovascular:     Rate and Rhythm: Normal rate and regular rhythm.     Heart sounds: Normal heart sounds.  Pulmonary:     Effort: Pulmonary effort is normal. No respiratory distress.     Breath sounds: Normal breath sounds. No wheezing or rales.  Chest:     Chest wall: No tenderness.  Abdominal:     General: Bowel sounds are normal. There is no distension or abdominal bruit.     Palpations: Abdomen is soft. There is no hepatomegaly, splenomegaly, mass or pulsatile mass.     Tenderness: There is no abdominal tenderness.  Musculoskeletal:        General: Normal range of motion.     Cervical back: Normal range of motion and neck supple.  Lymphadenopathy:     Cervical: No cervical adenopathy.  Skin:    General: Skin is warm and dry.  Neurological:     Mental Status: She is alert and oriented to person, place, and time.     Deep Tendon Reflexes: Reflexes are normal and symmetric.  Psychiatric:        Behavior: Behavior normal.        Thought Content: Thought content normal.        Judgment: Judgment normal.    BP (!) 154/83   Pulse 67   Temp 97.6 F (36.4 C) (Temporal)   Ht 5' 4"  (1.626 m)   Wt 167 lb 6.4 oz (75.9 kg)   SpO2 94%   BMI 28.73 kg/m          Assessment & Plan:  [name] comes in today with chief complaint of Medical Management of Chronic Issues   Diagnosis and orders addressed:  1. Hyperlipidemia with target LDL less than 100 Low fat diet - atorvastatin (LIPITOR) 40 MG tablet; Take 1 tablet (40 mg total) by mouth daily.  Dispense: 90 tablet; Refill: 1 - CBC with Differential/Platelet - CMP14+EGFR - Lipid panel  2. Gastroesophageal reflux disease without esophagitis Avoid spicy foods Do not eat 2 hours prior to bedtime   3. Osteopenia of lumbar spine Weight bearing exercise - DG [facility] DEXA  4. BMI 27.0-27.9,adult Discussed diet and exercise for person with BMI >25 Will recheck weight in  3-6 months    Labs pending Health Maintenance reviewed Diet and exercise encouraged  Follow up plan: 6 months   Mary-Margaret Hassell Done, FNP

## 2022-03-24 DIAGNOSIS — M85852 Other specified disorders of bone density and structure, left thigh: Secondary | ICD-10-CM | POA: Diagnosis not present

## 2022-03-24 DIAGNOSIS — M85851 Other specified disorders of bone density and structure, right thigh: Secondary | ICD-10-CM | POA: Diagnosis not present

## 2022-03-26 ENCOUNTER — Telehealth: Payer: Self-pay | Admitting: Nurse Practitioner

## 2022-03-26 MED ORDER — LISINOPRIL 20 MG PO TABS: 20.0000 mg | ORAL_TABLET | Freq: Every day | ORAL | 1 refills | Status: DC

## 2022-03-26 NOTE — Telephone Encounter (Signed)
Pt calling to give MMM her bp readings. Tuesday am 152/68 Cant remember the day-maybe Tuesday 141/69 Thursday am 156/68 Friday am 150/78 Please call back

## 2022-03-26 NOTE — Telephone Encounter (Signed)
Added lisinpril to meds for blood pressure- sent to [facility] Continue  to keep diary of blood pressure at home  Meds ordered this encounter  Medications   lisinopril (ZESTRIL) 20 MG tablet    Sig: Take 1 tablet (20 mg total) by mouth daily.    Dispense:  90 tablet    Refill:  1    Order Specific Question:   Supervising Provider    Answer:   Worthy Rancher [[id number]]   [place], FNP

## 2022-03-26 NOTE — Telephone Encounter (Signed)
Attempted to contact - NA 

## 2022-04-01 DIAGNOSIS — Z1231 Encounter for screening mammogram for malignant neoplasm of breast: Secondary | ICD-10-CM | POA: Diagnosis not present

## 2022-09-20 ENCOUNTER — Encounter: Payer: Self-pay | Admitting: Nurse Practitioner

## 2022-09-20 ENCOUNTER — Other Ambulatory Visit: Payer: Self-pay | Admitting: Nurse Practitioner

## 2022-09-20 ENCOUNTER — Ambulatory Visit (INDEPENDENT_AMBULATORY_CARE_PROVIDER_SITE_OTHER): Payer: Medicare HMO | Admitting: Nurse Practitioner

## 2022-09-20 ENCOUNTER — Ambulatory Visit (INDEPENDENT_AMBULATORY_CARE_PROVIDER_SITE_OTHER): Payer: Medicare HMO

## 2022-09-20 VITALS — BP 133/72 | HR 55 | Temp 96.9°F | Resp 20 | Ht 64.0 in | Wt 169.0 lb

## 2022-09-20 DIAGNOSIS — Z6827 Body mass index (BMI) 27.0-27.9, adult: Secondary | ICD-10-CM | POA: Diagnosis not present

## 2022-09-20 DIAGNOSIS — K219 Gastro-esophageal reflux disease without esophagitis: Secondary | ICD-10-CM

## 2022-09-20 DIAGNOSIS — E785 Hyperlipidemia, unspecified: Secondary | ICD-10-CM | POA: Diagnosis not present

## 2022-09-20 DIAGNOSIS — Z0000 Encounter for general adult medical examination without abnormal findings: Secondary | ICD-10-CM

## 2022-09-20 DIAGNOSIS — Z0001 Encounter for general adult medical examination with abnormal findings: Secondary | ICD-10-CM | POA: Diagnosis not present

## 2022-09-20 DIAGNOSIS — M8588 Other specified disorders of bone density and structure, other site: Secondary | ICD-10-CM | POA: Diagnosis not present

## 2022-09-20 DIAGNOSIS — R6889 Other general symptoms and signs: Secondary | ICD-10-CM | POA: Diagnosis not present

## 2022-09-20 DIAGNOSIS — H6992 Unspecified Eustachian tube disorder, left ear: Secondary | ICD-10-CM

## 2022-09-20 MED ORDER — ATORVASTATIN CALCIUM 40 MG PO TABS: 40.0000 mg | ORAL_TABLET | Freq: Every day | ORAL | 1 refills | Status: DC

## 2022-09-20 MED ORDER — FLUTICASONE PROPIONATE 50 MCG/ACT NA SUSP: 2.0000 | Freq: Every day | NASAL | 6 refills | Status: DC

## 2022-09-20 MED ORDER — LISINOPRIL 20 MG PO TABS: 20.0000 mg | ORAL_TABLET | Freq: Every day | ORAL | 1 refills | Status: DC

## 2022-09-20 NOTE — Patient Instructions (Signed)
Exercising to Stay Healthy To become healthy and stay healthy, it is recommended that you do moderate-intensity and vigorous-intensity exercise. You can tell that you are exercising at a moderate intensity if your heart starts beating faster and you start breathing faster but can still hold a conversation. You can tell that you are exercising at a vigorous intensity if you are breathing much harder and faster and cannot hold a conversation while exercising. How can exercise benefit me? Exercising regularly is important. It has many health benefits, such as: Improving overall fitness, flexibility, and endurance. Increasing bone density. Helping with weight control. Decreasing body fat. Increasing muscle strength and endurance. Reducing stress and tension, anxiety, depression, or anger. Improving overall health. What guidelines should I follow while exercising? Before you start a new exercise program, talk with your health care provider. Do not exercise so much that you hurt yourself, feel dizzy, or get very short of breath. Wear comfortable clothes and wear shoes with good support. Drink plenty of water while you exercise to prevent dehydration or heat stroke. Work out until your breathing and your heartbeat get faster (moderate intensity). How often should I exercise? Choose an activity that you enjoy, and set realistic goals. Your health care provider can help you make an activity plan that is individually designed and works best for you. Exercise regularly as told by your health care provider. This may include: Doing strength training two times a week, such as: Lifting weights. Using resistance bands. Push-ups. Sit-ups. Yoga. Doing a certain intensity of exercise for a given amount of time. Choose from these options: A total of 150 minutes of moderate-intensity exercise every week. A total of 75 minutes of vigorous-intensity exercise every week. A mix of moderate-intensity and  vigorous-intensity exercise every week. Children, pregnant women, people who have not exercised regularly, people who are overweight, and older adults may need to talk with a health care provider about what activities are safe to perform. If you have a medical condition, be sure to talk with your health care provider before you start a new exercise program. What are some exercise ideas? Moderate-intensity exercise ideas include: Walking 1 mile (1.6 km) in about 15 minutes. Biking. Hiking. Golfing. Dancing. Water aerobics. Vigorous-intensity exercise ideas include: Walking 4.5 miles (7.2 km) or more in about 1 hour. Jogging or running 5 miles (8 km) in about 1 hour. Biking 10 miles (16.1 km) or more in about 1 hour. Lap swimming. Roller-skating or in-line skating. Cross-country skiing. Vigorous competitive sports, such as football, basketball, and soccer. Jumping rope. Aerobic dancing. What are some everyday activities that can help me get exercise? Yard work, such as: Pushing a lawn mower. Raking and bagging leaves. Washing your car. Pushing a stroller. Shoveling snow. Gardening. Washing windows or floors. How can I be more active in my day-to-day activities? Use stairs instead of an elevator. Take a walk during your lunch break. If you drive, park your car farther away from your work or school. If you take public transportation, get off one stop early and walk the rest of the way. Stand up or walk around during all of your indoor phone calls. Get up, stretch, and walk around every 30 minutes throughout the day. Enjoy exercise with a friend. Support to continue exercising will help you keep a regular routine of activity. Where to find more information You can find more information about exercising to stay healthy from: U.S. Department of Health and Human Services: www.hhs.gov Centers for Disease Control and Prevention (  CDC): www.cdc.gov Summary Exercising regularly is  important. It will improve your overall fitness, flexibility, and endurance. Regular exercise will also improve your overall health. It can help you control your weight, reduce stress, and improve your bone density. Do not exercise so much that you hurt yourself, feel dizzy, or get very short of breath. Before you start a new exercise program, talk with your health care provider. This information is not intended to replace advice given to you by your health care provider. Make sure you discuss any questions you have with your health care provider. Document Revised: 10/10/2020 Document Reviewed: 10/10/2020 Elsevier Patient Education  2023 Elsevier Inc.  

## 2022-09-20 NOTE — Progress Notes (Signed)
Subjective:    Patient ID: Erica [name], female    DOB: May 27, 1951, 72 y.o.   MRN: [id number]  Chief Complaint: annual physical   HPI:  Erica [name] is a 72 y.o. who identifies as a female who was assigned female at birth.   Social history: Lives with: by herself Work history: retired   Scientist, forensic in today for follow up of the following chronic medical issues:  1. Annual physical exam  2. Hyperlipidemia with target LDL less than 100 Does watch diet and stays very active Lab Results  Component Value Date   CHOL 167 03/22/2022   HDL 52 03/22/2022   LDLCALC 91 03/22/2022   TRIG 136 03/22/2022   CHOLHDL 3.2 03/22/2022     3. Gastroesophageal reflux disease without esophagitis Takes otc meds as needed  4. Osteopenia of lumbar spine Weight bearing exercise daily. Last dexascan was [name] 03/24/22. Her t score as -1.5  5. BMI 27.0-27.9,adult Wt Readings from Last 3 Encounters:  09/20/22 169 lb (76.7 kg)  03/22/22 167 lb 6.4 oz (75.9 kg)  01/11/22 164 lb (74.4 kg)   BMI Readings from Last 3 Encounters:  09/20/22 29.01 kg/m  03/22/22 28.73 kg/m  01/11/22 28.15 kg/m      New complaints: None today  Allergies  Allergen Reactions   Povidone-Iodine Rash    Experienced are betadine was left on an area for and extended period.   Statins     Myalgias - Is now able to tolerate as long as she takees Co-Q-10   Outpatient Encounter Medications as of 09/20/2022  Medication Sig   atorvastatin (LIPITOR) 40 MG tablet Take 1 tablet (40 mg total) by mouth daily.   calcium carbonate (OSCAL) 1500 (600 Ca) MG TABS tablet Take by mouth daily with breakfast.   cholecalciferol (VITAMIN D3) 25 MCG (1000 UT) tablet Take 1,000 Units by mouth daily.   Coenzyme Q10 (CO Q-10) 200 MG CAPS Take by mouth.   fluticasone (FLONASE) 50 MCG/ACT nasal spray Place 2 sprays into both nostrils daily.   lisinopril (ZESTRIL) 20 MG tablet Take 1 tablet (20 mg total) by mouth daily. (NEEDS TO BE SEEN  BEFORE NEXT REFILL)   meclizine (ANTIVERT) 25 MG tablet Take 1 tablet (25 mg total) by mouth 3 (three) times daily as needed for dizziness.   Multiple Vitamins-Minerals (CENTRUM SILVER 50+WOMEN PO) Take 1 capsule by mouth daily.   No facility-administered encounter medications on file as of 09/20/2022.    Past Surgical History:  Procedure Laterality Date   CHOLECYSTECTOMY N/A 05/14/2014   Procedure: LAPAROSCOPIC CHOLECYSTECTOMY WITH INTRAOPERATIVE CHOLANGIOGRAM;  Surgeon: Gayland Curry, MD;  Location: [facility] OR;  Service: General;  Laterality: N/A;   NECK SURGERY     TONSILECTOMY/ADENOIDECTOMY WITH MYRINGOTOMY     TUBAL LIGATION      Family History  Problem Relation Age of Onset   Heart disease Father    Stroke Sister    Osteoporosis Mother    Heart attack Brother    Arthritis Daughter       Controlled substance contract: n/a     Review of Systems  Constitutional:  Negative for diaphoresis.  Eyes:  Negative for pain.  Respiratory:  Negative for shortness of breath.   Cardiovascular:  Negative for chest pain, palpitations and leg swelling.  Gastrointestinal:  Negative for abdominal pain.  Endocrine: Negative for polydipsia.  Skin:  Negative for rash.  Neurological:  Negative for dizziness, weakness and headaches.  Hematological:  Does not bruise/bleed  easily.  All other systems reviewed and are negative.      Objective:   Physical Exam Vitals and nursing note reviewed.  Constitutional:      General: She is not in acute distress.    Appearance: Normal appearance. She is well-developed.  HENT:     Head: Normocephalic.     Right Ear: Tympanic membrane normal.     Left Ear: Tympanic membrane normal.     Nose: Nose normal.     Mouth/Throat:     Mouth: Mucous membranes are moist.  Eyes:     Pupils: Pupils are equal, round, and reactive to light.  Neck:     Vascular: No carotid bruit or JVD.  Cardiovascular:     Rate and Rhythm: Normal rate and regular rhythm.      Heart sounds: Normal heart sounds.  Pulmonary:     Effort: Pulmonary effort is normal. No respiratory distress.     Breath sounds: Normal breath sounds. No wheezing or rales.  Chest:     Chest wall: No tenderness.  Abdominal:     General: Bowel sounds are normal. There is no distension or abdominal bruit.     Palpations: Abdomen is soft. There is no hepatomegaly, splenomegaly, mass or pulsatile mass.     Tenderness: There is no abdominal tenderness.  Musculoskeletal:        General: Normal range of motion.     Cervical back: Normal range of motion and neck supple.  Lymphadenopathy:     Cervical: No cervical adenopathy.  Skin:    General: Skin is warm and dry.  Neurological:     Mental Status: She is alert and oriented to person, place, and time.     Deep Tendon Reflexes: Reflexes are normal and symmetric.  Psychiatric:        Behavior: Behavior normal.        Thought Content: Thought content normal.        Judgment: Judgment normal.    BP 133/72   Pulse (!) 55   Temp (!) 96.9 F (36.1 C) (Temporal)   Resp 20   Ht 5\' 4"  (1.626 m)   Wt 169 lb (76.7 kg)   SpO2 96%   BMI 29.01 kg/m    Chest xray- normal findings-Preliminary reading by Ronnald Collum, FNP  [facility]  EKG- Kerry Hough, FNP        Assessment & Plan:  Erica [name] comes in today with chief complaint of Medical Management of Chronic Issues   Diagnosis and orders addressed:  1. Annual physical exam  2. Hyperlipidemia with target LDL less than 100 Low fat diet Continue to stay active - DG Chest 2 View - EKG 12-Lead - atorvastatin (LIPITOR) 40 MG tablet; Take 1 tablet (40 mg total) by mouth daily.  Dispense: 90 tablet; Refill: 1  3. Gastroesophageal reflux disease without esophagitis OTC meds as needed  4. Osteopenia of lumbar spine Weight bearing exercise  5. BMI 27.0-27.9,adult Discussed diet and exercise for person with BMI >25 Will recheck weight in 3-6 months   6. Chronic  dysfunction of left eustachian tube Use flonase to keep ears from getting stopped up - fluticasone (FLONASE) 50 MCG/ACT nasal spray; Place 2 sprays into both nostrils daily.  Dispense: 16 g; Refill: 6   Labs pending Health Maintenance reviewed Diet and exercise encouraged  Follow up plan: 6 months   Erica Hassell Done, FNP

## 2022-09-21 LAB — CMP14+EGFR
ALT: 29 IU/L (ref 0–32)
AST: 30 IU/L (ref 0–40)
Albumin/Globulin Ratio: 2.1 (ref 1.2–2.2)
Albumin: 4.5 g/dL (ref 3.8–4.8)
Alkaline Phosphatase: 100 IU/L (ref 44–121)
BUN/Creatinine Ratio: 22 (ref 12–28)
BUN: 16 mg/dL (ref 8–27)
Bilirubin Total: 0.4 mg/dL (ref 0.0–1.2)
CO2: 24 mmol/L (ref 20–29)
Calcium: 9.2 mg/dL (ref 8.7–10.3)
Chloride: 102 mmol/L (ref 96–106)
Creatinine, Ser: 0.73 mg/dL (ref 0.57–1.00)
Globulin, Total: 2.1 g/dL (ref 1.5–4.5)
Glucose: 88 mg/dL (ref 70–99)
Potassium: 4.3 mmol/L (ref 3.5–5.2)
Sodium: 142 mmol/L (ref 134–144)
Total Protein: 6.6 g/dL (ref 6.0–8.5)
eGFR: 88 mL/min/{1.73_m2} (ref >59)

## 2022-09-21 LAB — LIPID PANEL
Chol/HDL Ratio: 3.6 ratio (ref 0.0–4.4)
Cholesterol, Total: 179 mg/dL (ref 100–199)
HDL: 50 mg/dL
LDL Chol Calc (NIH): 100 mg/dL — ABNORMAL HIGH (ref 0–99)
Triglycerides: 168 mg/dL — ABNORMAL HIGH (ref 0–149)
VLDL Cholesterol Cal: 29 mg/dL (ref 5–40)

## 2022-09-21 LAB — THYROID PANEL WITH TSH
Free Thyroxine Index: 2 (ref 1.2–4.9)
T3 Uptake Ratio: 28 % (ref 24–39)
T4, Total: 7.1 ug/dL (ref 4.5–12.0)
TSH: 3.39 u[IU]/mL (ref 0.450–4.500)

## 2022-09-21 LAB — CBC WITH DIFFERENTIAL/PLATELET
Basophils Absolute: 0.1 10*3/uL (ref 0.0–0.2)
Basos: 1 %
EOS (ABSOLUTE): 0.2 10*3/uL (ref 0.0–0.4)
Eos: 3 %
Hematocrit: 45.4 % (ref 34.0–46.6)
Hemoglobin: 14.8 g/dL (ref 11.1–15.9)
Immature Grans (Abs): 0 10*3/uL (ref 0.0–0.1)
Immature Granulocytes: 0 %
Lymphocytes Absolute: 2.3 10*3/uL (ref 0.7–3.1)
Lymphs: 32 %
MCH: 31.1 pg (ref 26.6–33.0)
MCHC: 32.6 g/dL (ref 31.5–35.7)
MCV: 95 fL (ref 79–97)
Monocytes Absolute: 0.6 10*3/uL (ref 0.1–0.9)
Monocytes: 9 %
Neutrophils Absolute: 4 10*3/uL (ref 1.4–7.0)
Neutrophils: 55 %
Platelets: 278 10*3/uL (ref 150–450)
RBC: 4.76 x10E6/uL (ref 3.77–5.28)
RDW: 13.1 % (ref 11.7–15.4)
WBC: 7.2 10*3/uL (ref 3.4–10.8)

## 2022-09-21 LAB — VITAMIN D 25 HYDROXY (VIT D DEFICIENCY, FRACTURES): Vit D, 25-Hydroxy: 52.3 ng/mL (ref 30.0–100.0)

## 2022-10-04 ENCOUNTER — Ambulatory Visit (INDEPENDENT_AMBULATORY_CARE_PROVIDER_SITE_OTHER): Payer: Medicare HMO

## 2022-10-04 VITALS — Ht 64.0 in | Wt 156.0 lb

## 2022-10-04 DIAGNOSIS — Z0000 Encounter for general adult medical examination without abnormal findings: Secondary | ICD-10-CM | POA: Diagnosis not present

## 2022-10-04 NOTE — Patient Instructions (Signed)
Erica [name] , Thank you for taking time to come for your Medicare Wellness Visit. I appreciate your ongoing commitment to your health goals. Please review the following plan we discussed and let me know if I can assist you in the future.   These are the goals we discussed:  Goals      Exercise 3x per week (30 min per time)     Continue walking at least 30 minutes daily     Patient Stated     09/30/2021 AWV Goal: Exercise for General Health and maintain independence - lose a little weight  Patient will verbalize understanding of the benefits of increased physical activity: Exercising regularly is important. It will improve your overall fitness, flexibility, and endurance. Regular exercise also will improve your overall health. It can help you control your weight, reduce stress, and improve your bone density. Over the next year, patient will increase physical activity as tolerated with a goal of at least 150 minutes of moderate physical activity per week.  You can tell that you are exercising at a moderate intensity if your heart starts beating faster and you start breathing faster but can still hold a conversation. Moderate-intensity exercise ideas include: Walking 1 mile (1.6 km) in about 15 minutes Biking Hiking Golfing Dancing Water aerobics Patient will verbalize understanding of everyday activities that increase physical activity by providing examples like the following: Yard work, such as: Insurance underwriter Gardening Washing windows or floors Patient will be able to explain general safety guidelines for exercising:  Before you start a new exercise program, talk with your health care provider. Do not exercise so much that you hurt yourself, feel dizzy, or get very short of breath. Wear comfortable clothes and wear shoes with good support. Drink plenty of water while you exercise to prevent  dehydration or heat stroke. Work out until your breathing and your heartbeat get faster.         This is a list of the screening recommended for you and due dates:  Health Maintenance  Topic Date Due   Screening for Lung Cancer  Never done   COVID-19 Vaccine (4 - 2023-24 season) 10/06/2022*   Flu Shot  01/27/2023   Mammogram  04/02/2023   Colon Cancer Screening  05/17/2023   Medicare Annual Wellness Visit  10/04/2023   DEXA scan (bone density measurement)  03/24/2024   DTaP/Tdap/Td vaccine (2 - Td or Tdap) 01/25/2029   Pneumonia Vaccine  Completed   Hepatitis C Screening: USPSTF Recommendation to screen - Ages 74-79 yo.  Completed   Zoster (Shingles) Vaccine  Completed   HPV Vaccine  Aged Out  *Topic was postponed. The date shown is not the original due date.    Advanced directives: Please bring a copy of your health care power of attorney and living will to the office to be added to your chart at your convenience.   Conditions/risks identified: Aim for 30 minutes of exercise or brisk walking, 6-8 glasses of water, and 5 servings of fruits and vegetables each day.   Next appointment: Follow up in one year for your annual wellness visit    Preventive Care 65 Years and Older, Female Preventive care refers to lifestyle choices and visits with your health care provider that can promote health and wellness. What does preventive care include? A yearly physical exam. This is also called an annual well check. Dental exams once or twice  a year. Routine eye exams. Ask your health care provider how often you should have your eyes checked. Personal lifestyle choices, including: Daily care of your teeth and gums. Regular physical activity. Eating a healthy diet. Avoiding tobacco and drug use. Limiting alcohol use. Practicing safe sex. Taking low-dose aspirin every day. Taking vitamin and mineral supplements as recommended by your health care provider. What happens during an annual  well check? The services and screenings done by your health care provider during your annual well check will depend on your age, overall health, lifestyle risk factors, and family history of disease. Counseling  Your health care provider may ask you questions about your: Alcohol use. Tobacco use. Drug use. Emotional well-being. Home and relationship well-being. Sexual activity. Eating habits. History of falls. Memory and ability to understand (cognition). Work and work Astronomer. Reproductive health. Screening  You may have the following tests or measurements: Height, weight, and BMI. Blood pressure. Lipid and cholesterol levels. These may be checked every 5 years, or more frequently if you are over 71 years old. Skin check. Lung cancer screening. You may have this screening every year starting at age 7 if you have a 30-pack-year history of smoking and currently smoke or have quit within the past 15 years. Fecal occult blood test (FOBT) of the stool. You may have this test every year starting at age 61. Flexible sigmoidoscopy or colonoscopy. You may have a sigmoidoscopy every 5 years or a colonoscopy every 10 years starting at age 69. Hepatitis C blood test. Hepatitis B blood test. Sexually transmitted disease (STD) testing. Diabetes screening. This is done by checking your blood sugar (glucose) after you have not eaten for a while (fasting). You may have this done every 1-3 years. Bone density scan. This is done to screen for osteoporosis. You may have this done starting at age 70. Mammogram. This may be done every 1-2 years. Talk to your health care provider about how often you should have regular mammograms. Talk with your health care provider about your test results, treatment options, and if necessary, the need for more tests. Vaccines  Your health care provider may recommend certain vaccines, such as: Influenza vaccine. This is recommended every year. Tetanus, diphtheria,  and acellular pertussis (Tdap, Td) vaccine. You may need a Td booster every 10 years. Zoster vaccine. You may need this after age 70. Pneumococcal 13-valent conjugate (PCV13) vaccine. One dose is recommended after age 72. Pneumococcal polysaccharide (PPSV23) vaccine. One dose is recommended after age 33. Talk to your health care provider about which screenings and vaccines you need and how often you need them. This information is not intended to replace advice given to you by your health care provider. Make sure you discuss any questions you have with your health care provider. Document Released: 07/11/2015 Document Revised: 03/03/2016 Document Reviewed: 04/15/2015 Elsevier Interactive Patient Education  2017 [employer].  Fall Prevention in the Home Falls can cause injuries. They can happen to people of all ages. There are many things you can do to make your home safe and to help prevent falls. What can I do on the outside of my home? Regularly fix the edges of walkways and driveways and fix any cracks. Remove anything that might make you trip as you walk through a door, such as a raised step or threshold. Trim any bushes or trees on the path to your home. Use bright outdoor lighting. Clear any walking paths of anything that might make someone trip, such as rocks  or tools. Regularly check to see if handrails are loose or broken. Make sure that both sides of any steps have handrails. Any raised decks and porches should have guardrails on the edges. Have any leaves, snow, or ice cleared regularly. Use sand or salt on walking paths during winter. Clean up any spills in your garage right away. This includes oil or grease spills. What can I do in the bathroom? Use night lights. Install grab bars by the toilet and in the tub and shower. Do not use towel bars as grab bars. Use non-skid mats or decals in the tub or shower. If you need to sit down in the shower, use a plastic, non-slip  stool. Keep the floor dry. Clean up any water that spills on the floor as soon as it happens. Remove soap buildup in the tub or shower regularly. Attach bath mats securely with double-sided non-slip rug tape. Do not have throw rugs and other things on the floor that can make you trip. What can I do in the bedroom? Use night lights. Make sure that you have a light by your bed that is easy to reach. Do not use any sheets or blankets that are too big for your bed. They should not hang down onto the floor. Have a firm chair that has side arms. You can use this for support while you get dressed. Do not have throw rugs and other things on the floor that can make you trip. What can I do in the kitchen? Clean up any spills right away. Avoid walking on wet floors. Keep items that you use a lot in easy-to-reach places. If you need to reach something above you, use a strong step stool that has a grab bar. Keep electrical cords out of the way. Do not use floor polish or wax that makes floors slippery. If you must use wax, use non-skid floor wax. Do not have throw rugs and other things on the floor that can make you trip. What can I do with my stairs? Do not leave any items on the stairs. Make sure that there are handrails on both sides of the stairs and use them. Fix handrails that are broken or loose. Make sure that handrails are as long as the stairways. Check any carpeting to make sure that it is firmly attached to the stairs. Fix any carpet that is loose or worn. Avoid having throw rugs at the top or bottom of the stairs. If you do have throw rugs, attach them to the floor with carpet tape. Make sure that you have a light switch at the top of the stairs and the bottom of the stairs. If you do not have them, ask someone to add them for you. What else can I do to help prevent falls? Wear shoes that: Do not have high heels. Have rubber bottoms. Are comfortable and fit you well. Are closed at the  toe. Do not wear sandals. If you use a stepladder: Make sure that it is fully opened. Do not climb a closed stepladder. Make sure that both sides of the stepladder are locked into place. Ask someone to hold it for you, if possible. Clearly mark and make sure that you can see: Any grab bars or handrails. First and last steps. Where the edge of each step is. Use tools that help you move around (mobility aids) if they are needed. These include: Canes. Walkers. Scooters. Crutches. Turn on the lights when you go into a dark area.  Replace any light bulbs as soon as they burn out. Set up your furniture so you have a clear path. Avoid moving your furniture around. If any of your floors are uneven, fix them. If there are any pets around you, be aware of where they are. Review your medicines with your doctor. Some medicines can make you feel dizzy. This can increase your chance of falling. Ask your doctor what other things that you can do to help prevent falls. This information is not intended to replace advice given to you by your health care provider. Make sure you discuss any questions you have with your health care provider. Document Released: 04/10/2009 Document Revised: 11/20/2015 Document Reviewed: 07/19/2014 Elsevier Interactive Patient Education  2017 [employer].

## 2022-10-04 NOTE — Progress Notes (Signed)
Subjective:   Erica [name] is a 72 y.o. female who presents for Medicare Annual (Subsequent) preventive examination.  Review of Systems    I connected with  Erica [name] on 10/04/22 by a audio enabled telemedicine application and verified that I am speaking with the correct person using two identifiers.  Patient Location: Home  Provider Location: Home Office  I discussed the limitations of evaluation and management by telemedicine. The patient expressed understanding and agreed to proceed.  Cardiac Risk Factors include: advanced age (>31men, >61 women);dyslipidemia;hypertension     Objective:    Today's Vitals   10/04/22 0817  Weight: 156 lb (70.8 kg)  Height: 5\' 4"  (1.626 m)   Body mass index is 26.78 kg/m.     10/04/2022    8:20 AM 09/30/2021    8:11 AM 09/29/2020    8:21 AM 09/27/2019    8:39 AM 12/20/2017    2:54 PM 05/09/2014    8:35 AM  Advanced Directives  Does Patient Have a Medical Advance Directive? Yes Yes Yes Yes Yes No  Type of Estate agent of [place];Living will Healthcare Power of [place];Living will Healthcare Power of [place];Living will Healthcare Power of [place];Living will;Out of facility DNR (pink MOST or yellow form)    Copy of Healthcare Power of Attorney in Chart? No - copy requested No - copy requested No - copy requested     Would patient like information on creating a medical advance directive?      Yes - Educational materials given    Current Medications (verified) Outpatient Encounter Medications as of 10/04/2022  Medication Sig   atorvastatin (LIPITOR) 40 MG tablet Take 1 tablet (40 mg total) by mouth daily.   calcium carbonate (OSCAL) 1500 (600 Ca) MG TABS tablet Take by mouth daily with breakfast.   cholecalciferol (VITAMIN D3) 25 MCG (1000 UT) tablet Take 1,000 Units by mouth daily.   Coenzyme Q10 (CO Q-10) 200 MG CAPS Take by mouth.   fluticasone (FLONASE) 50 MCG/ACT nasal spray Place 2 sprays into both nostrils  daily.   lisinopril (ZESTRIL) 20 MG tablet Take 1 tablet (20 mg total) by mouth daily. (NEEDS TO BE SEEN BEFORE NEXT REFILL)   meclizine (ANTIVERT) 25 MG tablet Take 1 tablet (25 mg total) by mouth 3 (three) times daily as needed for dizziness.   Multiple Vitamins-Minerals (CENTRUM SILVER 50+WOMEN PO) Take 1 capsule by mouth daily.   No facility-administered encounter medications on file as of 10/04/2022.    Allergies (verified) Povidone-iodine and Statins   History: Past Medical History:  Diagnosis Date   Complication of anesthesia    Hyperlipidemia    Osteopenia    Past Surgical History:  Procedure Laterality Date   CHOLECYSTECTOMY N/A 05/14/2014   Procedure: LAPAROSCOPIC CHOLECYSTECTOMY WITH INTRAOPERATIVE CHOLANGIOGRAM;  Surgeon: Atilano Ina, MD;  Location[facility] OR;  Service: General;  Laterality: N/A;   NECK SURGERY     TONSILECTOMY/ADENOIDECTOMY WITH MYRINGOTOMY     TUBAL LIGATION     Family History  Problem Relation Age of Onset   Heart disease Father    Stroke Sister    Osteoporosis Mother    Heart attack Brother    Arthritis Daughter    Social History   Socioeconomic History   Marital status: Divorced    Spouse name: Not on file   Number of children: 3   Years of education: ged   Highest education level: GED or equivalent  Occupational History   Occupation: Designer, fashion/clothing  Comment: retired  Tobacco Use   Smoking status: Former    Packs/day: 1.00    Years: 40.00    Additional pack years: 0.00    Total pack years: 40.00    Types: Cigarettes    Quit date: 06/29/2007    Years since quitting: 15.2   Smokeless tobacco: Never  Vaping Use   Vaping Use: Never used  Substance and Sexual Activity   Alcohol use: Yes    Alcohol/week: 1.0 standard drink of alcohol    Types: 1 Glasses of wine per week    Comment: rarely   Drug use: No   Sexual activity: Not on file  Other Topics Concern   Not on file  Social History Narrative   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 She  is divorced and lives alone. She has 2 daughters and 1 son. She sees her children regularly. She enjoys golf and gardening.    Social Determinants of Health   Financial Resource Strain: Low Risk  (10/04/2022)   Overall Financial Resource Strain (CARDIA)    Difficulty of Paying Living Expenses: Not hard at all  Food Insecurity: No Food Insecurity (10/04/2022)   Hunger Vital Sign    Worried About Running Out of Food in the Last Year: Never true    Ran Out of Food in the Last Year: Never true  Transportation Needs: No Transportation Needs (10/04/2022)   PRAPARE - Administrator, Civil ServiceTransportation    Lack of Transportation (Medical): No    Lack of Transportation (Non-Medical): No  Physical Activity: Sufficiently Active (10/04/2022)   Exercise Vital Sign    Days of Exercise per Week: 5 days    Minutes of Exercise per Session: 30 min  Stress: No Stress Concern Present (10/04/2022)   [employer] - Occupational Stress Questionnaire    Feeling of Stress : Not at all  Social Connections: Socially Isolated (10/04/2022)   Social Connection and Isolation Panel [NHANES]    Frequency of Communication with Friends and Family: More than three times a week    Frequency of Social Gatherings with Friends and Family: More than three times a week    Attends Religious Services: Never    Database administratorActive Member of Clubs or Organizations: No    Attends Engineer, structuralClub or Organization Meetings: Never    Marital Status: Divorced    Tobacco Counseling Counseling given: Not Answered   Clinical Intake:  Pre-visit preparation completed: Yes  Pain : No/denies pain     Nutritional Risks: None Diabetes: No  How often do you need to have someone help you when you read instructions, pamphlets, or other written materials from your doctor or pharmacy?: 1 - Never  Diabetic?no   Interpreter Needed?: No  Information entered by :: Renie OraLaura Wilson, LPN   Activities of Daily Living    10/04/2022    8:21 AM 09/30/2022    6:50 PM   In your present state of health, do you have any difficulty performing the following activities:  Hearing? 0 0  Vision? 0 0  Difficulty concentrating or making decisions? 0 0  Walking or climbing stairs? 0 0  Dressing or bathing? 0 0  Doing errands, shopping? 0 0  Preparing Food and eating ? N N  Using the Toilet? N N  In the past six months, have you accidently leaked urine? N N  Do you have problems with loss of bowel control? N N  Managing your Medications? N N  Managing your Finances? N N  Housekeeping or managing  your Housekeeping? N N    Patient Care Team: Bennie Pierini, FNP as PCP - General (Nurse Practitioner) Marcelino Duster, MD as Referring Physician (Dermatology) Delora Fuel, OD (Optometry)  Indicate any recent Medical Services you may have received from other than [facility] providers in the past year (date may be approximate).     Assessment:   This is a routine wellness examination for [place].  Hearing/Vision screen Vision Screening - Comments:: Wears rx glasses - up to date with routine eye exams with  Dr.Johnson   Dietary issues and exercise activities discussed: Current Exercise Habits: Home exercise routine, Type of exercise: walking, Time (Minutes): 30, Frequency (Times/Week): 5, Weekly Exercise (Minutes/Week): 150, Intensity: Mild, Exercise limited by: None identified   Goals Addressed             This Visit's Progress    Exercise 3x per week (30 min per time)   On track    Continue walking at least 30 minutes daily     Patient Stated   On track    09/30/2021 AWV Goal: Exercise for General Health and maintain independence - lose a little weight  Patient will verbalize understanding of the benefits of increased physical activity: Exercising regularly is important. It will improve your overall fitness, flexibility, and endurance. Regular exercise also will improve your overall health. It can help you control your weight, reduce stress, and  improve your bone density. Over the next year, patient will increase physical activity as tolerated with a goal of at least 150 minutes of moderate physical activity per week.  You can tell that you are exercising at a moderate intensity if your heart starts beating faster and you start breathing faster but can still hold a conversation. Moderate-intensity exercise ideas include: Walking 1 mile (1.6 km) in about 15 minutes Biking Hiking Golfing Dancing Water aerobics Patient will verbalize understanding of everyday activities that increase physical activity by providing examples like the following: Yard work, such as: Insurance underwriter Gardening Washing windows or floors Patient will be able to explain general safety guidelines for exercising:  Before you start a new exercise program, talk with your health care provider. Do not exercise so much that you hurt yourself, feel dizzy, or get very short of breath. Wear comfortable clothes and wear shoes with good support. Drink plenty of water while you exercise to prevent dehydration or heat stroke. Work out until your breathing and your heartbeat get faster.        Depression Screen    10/04/2022    8:19 AM 09/20/2022   10:12 AM 03/22/2022    9:47 AM 01/11/2022    8:16 AM 09/30/2021    8:09 AM 09/17/2021    8:16 AM 03/20/2021    8:36 AM  PHQ 2/9 Scores  PHQ - 2 Score 0 0 0 0 0 0 0  PHQ- 9 Score 0 0 1 0 4 4 2     Fall Risk    10/04/2022    8:18 AM 09/30/2022    6:50 PM 09/20/2022   10:12 AM 03/22/2022    9:47 AM 01/11/2022    8:16 AM  Fall Risk   Falls in the past year? 0 0 0 0 0  Number falls in past yr: 0 0     Injury with Fall? 0 0     Risk for fall due to : No Fall Risks  Follow up Falls prevention discussed        FALL RISK PREVENTION PERTAINING TO THE HOME:  Any stairs in or around the home? Yes  If so, are there any without handrails? No   Home free of loose throw rugs in walkways, pet beds, electrical cords, etc? Yes  Adequate lighting in your home to reduce risk of falls? Yes   ASSISTIVE DEVICES UTILIZED TO PREVENT FALLS:  Life alert? No  Use of a cane, walker or w/c? No  Grab bars in the bathroom? No  Shower chair or bench in shower? No  Elevated toilet seat or a handicapped toilet? No          10/04/2022    8:21 AM 09/30/2021    8:12 AM 09/27/2019    8:41 AM  6CIT Screen  What Year? 0 points 0 points 0 points  What month? 0 points 0 points 0 points  What time? 0 points 0 points 0 points  Count back from 20 0 points 0 points 0 points  Months in reverse 0 points 0 points 0 points  Repeat phrase 0 points 0 points 0 points  Total Score 0 points 0 points 0 points    Immunizations Immunization History  Administered Date(s) Administered   Fluad Quad(high Dose 65+) 03/13/2019, 03/18/2020, 03/20/2021   Influenza Split 04/17/2013   Influenza, High Dose Seasonal PF 04/12/2017, 04/17/2018   Influenza-Unspecified 04/16/2014, 04/08/2015, 04/06/2016   Moderna SARS-COV2 Booster Vaccination 05/15/2020   Moderna Sars-Covid-2 Vaccination 08/03/2019, 08/31/2019   Pneumococcal Conjugate-13 03/10/2018   Pneumococcal Polysaccharide-23 03/13/2019   Tdap 01/26/2019   Zoster Recombinat (Shingrix) 03/20/2021, 03/22/2022   Zoster, Live 04/21/2011    TDAP status: Up to date  Flu Vaccine status: Up to date  Pneumococcal vaccine status: Up to date  Covid-19 vaccine status: Completed vaccines  Qualifies for Shingles Vaccine? Yes   Zostavax completed Yes   Shingrix Completed?: Yes  Screening Tests Health Maintenance  Topic Date Due   Lung Cancer Screening  Never done   COVID-19 Vaccine (4 - 2023-24 season) 10/06/2022 (Originally 02/26/2022)   INFLUENZA VACCINE  01/27/2023   MAMMOGRAM  04/02/2023   COLONOSCOPY (Pts 45-7yrs Insurance coverage will need to be confirmed)  05/17/2023   Medicare Annual Wellness (AWV)   10/04/2023   DEXA SCAN  03/24/2024   DTaP/Tdap/Td (2 - Td or Tdap) 01/25/2029   Pneumonia Vaccine 91+ Years old  Completed   Hepatitis C Screening  Completed   Zoster Vaccines- Shingrix  Completed   HPV VACCINES  Aged Out    Health Maintenance  Health Maintenance Due  Topic Date Due   Lung Cancer Screening  Never done    Colorectal cancer screening: Type of screening: Colonoscopy. Completed 05/16/2018. Repeat every 5 years  Mammogram status: Completed 04/01/2022. Repeat every year  Bone Density status: Completed 03/24/2022. Results reflect: Bone density results: OSTEOPENIA. Repeat every 5 years.  Lung Cancer Screening: (Low Dose CT Chest recommended if Age 25-80 years, 30 pack-year currently smoking OR have quit w/in 15years.) does not qualify.   Lung Cancer Screening Referral: n/a  Additional Screening:  Hepatitis C Screening: does not qualify; Completed 10/21/2015  Vision Screening: Recommended annual ophthalmology exams for early detection of glaucoma and other disorders of the eye. Is the patient up to date with their annual eye exam?  Yes  Who is the provider or what is the name of the office in which the patient attends annual eye exams? Dr.johnson  If pt is not established  with a provider, would they like to be referred to a provider to establish care? No .   Dental Screening: Recommended annual dental exams for proper oral hygiene  Community Resource Referral / Chronic Care Management: CRR required this visit?  No   CCM required this visit?  No      Plan:     I have personally reviewed and noted the following in the patient's chart:   Medical and social history Use of alcohol, tobacco or illicit drugs  Current medications and supplements including opioid prescriptions. Patient is not currently taking opioid prescriptions. Functional ability and status Nutritional status Physical activity Advanced directives List of other physicians Hospitalizations,  surgeries, and ER visits in previous 12 months Vitals Screenings to include cognitive, depression, and falls Referrals and appointments  In addition, I have reviewed and discussed with patient certain preventive protocols, quality metrics, and best practice recommendations. A written personalized care plan for preventive services as well as general preventive health recommendations were provided to patient.     Lorrene Reid, LPN   6/0/6301   Nurse Notes: none

## 2022-10-06 DIAGNOSIS — H524 Presbyopia: Secondary | ICD-10-CM | POA: Diagnosis not present

## 2022-11-05 ENCOUNTER — Encounter: Payer: Self-pay | Admitting: Nurse Practitioner

## 2022-11-05 ENCOUNTER — Ambulatory Visit (INDEPENDENT_AMBULATORY_CARE_PROVIDER_SITE_OTHER): Payer: Medicare HMO | Admitting: Nurse Practitioner

## 2022-11-05 VITALS — BP 134/68 | HR 68 | Ht 64.0 in | Wt 169.0 lb

## 2022-11-05 DIAGNOSIS — M25552 Pain in left hip: Secondary | ICD-10-CM

## 2022-11-05 MED ORDER — PREDNISONE 20 MG PO TABS: 40.0000 mg | ORAL_TABLET | Freq: Every day | ORAL | 0 refills | Status: AC

## 2022-11-05 NOTE — Progress Notes (Signed)
   Subjective:    Patient ID: Erica [name], female    DOB: 04-03-1951, 72 y.o.   MRN: [id number]   Chief Complaint: Hip Pain (Left. Started about one year ago but went away. Now back for the past month. Denies injury.)   Patient started having hip pain about 2 months ago. Has gotten some better, but still bothers her at times.   Hip Pain  There was no injury mechanism. The pain is present in the left hip. The quality of the pain is described as shooting and aching. The pain is at a severity of 1/10. The pain is mild. The pain has been Fluctuating since onset. Associated symptoms include an inability to bear weight and a loss of motion. She reports no foreign bodies present. The symptoms are aggravated by weight bearing. She has tried NSAIDs for the symptoms. The treatment provided mild relief.    Patient Active Problem List   Diagnosis Date Noted   BMI 27.0-27.9,adult 10/21/2015   Osteopenia 03/11/2014   Gastroesophageal reflux disease without esophagitis 01/15/2014   Hyperlipidemia with target LDL less than 100 01/15/2014       Review of Systems  Constitutional:  Negative for diaphoresis.  Eyes:  Negative for pain.  Respiratory:  Negative for shortness of breath.   Cardiovascular:  Negative for chest pain, palpitations and leg swelling.  Gastrointestinal:  Negative for abdominal pain.  Endocrine: Negative for polydipsia.  Skin:  Negative for rash.  Neurological:  Negative for dizziness, weakness and headaches.  Hematological:  Does not bruise/bleed easily.  All other systems reviewed and are negative.      Objective:   Physical Exam Constitutional:      Appearance: Normal appearance.  Cardiovascular:     Rate and Rhythm: Normal rate and regular rhythm.     Heart sounds: Normal heart sounds.  Pulmonary:     Effort: Pulmonary effort is normal.     Breath sounds: Normal breath sounds.  Skin:    General: Skin is warm.  Neurological:     General: No focal deficit  present.     Mental Status: She is alert and oriented to person, place, and time.  Psychiatric:        Mood and Affect: Mood normal.        Behavior: Behavior normal.     BP 134/68   Pulse 68   Ht 5\' 4"  (1.626 m)   Wt 169 lb (76.7 kg)   SpO2 97%   BMI 29.01 kg/m        Assessment & Plan:   Erica [name] in today with chief complaint of Hip Pain (Left. Started about one year ago but went away. Now back for the past month. Denies injury.)   1. Pain of left hip Moist heat Rest when hurting If steroids do not help, come back for xrays ( NO xray available today )    The above assessment and management plan was discussed with the patient. The patient verbalized understanding of and has agreed to the management plan. Patient is aware to call the clinic if symptoms persist or worsen. Patient is aware when to return to the clinic for a follow-up visit. Patient educated on when it is appropriate to go to the emergency department.   Mary-Margaret Daphine Deutscher, FNP

## 2022-11-05 NOTE — Patient Instructions (Signed)
Hip Pain The hip is the joint between the upper legs and the lower pelvis. The bones, cartilage, tendons, and muscles of your hip joint support your body and allow you to move around. Hip pain can range from a minor ache to severe pain in one or both of your hips. The pain may be felt on the inside of the hip joint near the groin, or on the outside near the buttocks and upper thigh. You may also have swelling or stiffness in your hip area. Follow these instructions at home: Managing pain, stiffness, and swelling     If told, put ice on the painful area. Put ice in a plastic bag. Place a towel between your skin and the bag. Leave the ice on for 20 minutes, 2-3 times a day. If told, apply heat to the affected area as often as told by your health care provider. Use the heat source that your provider recommends, such as a moist heat pack or a heating pad. Place a towel between your skin and the heat source. Leave the heat on for 20-30 minutes. If your skin turns bright red, remove the ice or heat right away to prevent skin damage. The risk of damage is higher if you cannot feel pain, heat, or cold. Activity Do exercises as told by your provider. Avoid activities that cause pain. General instructions  Take over-the-counter and prescription medicines only as told by your provider. Keep a journal of your symptoms. Write down: How often you have hip pain. The location of your pain. What the pain feels like. What makes the pain worse. Sleep with a pillow between your legs on your most comfortable side. Keep all follow-up visits. Your provider will monitor your pain and activity. Contact a health care provider if: You cannot put weight on your leg. Your pain or swelling gets worse after a week. It gets harder to walk. You have a fever. Get help right away if: You fall. You have a sudden increase in pain and swelling in your hip. Your hip is red or swollen or very tender to touch. This  information is not intended to replace advice given to you by your health care provider. Make sure you discuss any questions you have with your health care provider. Document Revised: 02/16/2022 Document Reviewed: 02/16/2022 Elsevier Patient Education  2023 Elsevier Inc.  

## 2023-03-21 ENCOUNTER — Ambulatory Visit: Payer: Medicare HMO | Admitting: Nurse Practitioner

## 2023-03-21 ENCOUNTER — Encounter: Payer: Self-pay | Admitting: Nurse Practitioner

## 2023-03-21 VITALS — BP 138/78 | HR 66 | Temp 97.7°F | Resp 20 | Ht 64.0 in | Wt 168.0 lb

## 2023-03-21 DIAGNOSIS — E785 Hyperlipidemia, unspecified: Secondary | ICD-10-CM

## 2023-03-21 DIAGNOSIS — Z6827 Body mass index (BMI) 27.0-27.9, adult: Secondary | ICD-10-CM | POA: Diagnosis not present

## 2023-03-21 DIAGNOSIS — M8588 Other specified disorders of bone density and structure, other site: Secondary | ICD-10-CM

## 2023-03-21 DIAGNOSIS — K219 Gastro-esophageal reflux disease without esophagitis: Secondary | ICD-10-CM

## 2023-03-21 DIAGNOSIS — Z23 Encounter for immunization: Secondary | ICD-10-CM

## 2023-03-21 LAB — CMP14+EGFR
ALT: 22 IU/L (ref 0–32)
AST: 30 IU/L (ref 0–40)
Albumin: 4.1 g/dL (ref 3.8–4.8)
Alkaline Phosphatase: 105 IU/L (ref 44–121)
BUN/Creatinine Ratio: 28 (ref 12–28)
BUN: 21 mg/dL (ref 8–27)
Bilirubin Total: 0.4 mg/dL (ref 0.0–1.2)
CO2: 24 mmol/L (ref 20–29)
Calcium: 9 mg/dL (ref 8.7–10.3)
Chloride: 104 mmol/L (ref 96–106)
Creatinine, Ser: 0.75 mg/dL (ref 0.57–1.00)
Globulin, Total: 2.4 g/dL (ref 1.5–4.5)
Glucose: 91 mg/dL (ref 70–99)
Potassium: 4.3 mmol/L (ref 3.5–5.2)
Sodium: 141 mmol/L (ref 134–144)
Total Protein: 6.5 g/dL (ref 6.0–8.5)
eGFR: 85 mL/min/{1.73_m2} (ref >59)

## 2023-03-21 LAB — CBC WITH DIFFERENTIAL/PLATELET
Basophils Absolute: 0.1 10*3/uL (ref 0.0–0.2)
Basos: 1 %
EOS (ABSOLUTE): 0.2 10*3/uL (ref 0.0–0.4)
Eos: 3 %
Hematocrit: 43.5 % (ref 34.0–46.6)
Hemoglobin: 14.3 g/dL (ref 11.1–15.9)
Immature Grans (Abs): 0 10*3/uL (ref 0.0–0.1)
Immature Granulocytes: 0 %
Lymphocytes Absolute: 2 10*3/uL (ref 0.7–3.1)
Lymphs: 29 %
MCH: 31 pg (ref 26.6–33.0)
MCHC: 32.9 g/dL (ref 31.5–35.7)
MCV: 94 fL (ref 79–97)
Monocytes Absolute: 0.7 10*3/uL (ref 0.1–0.9)
Monocytes: 10 %
Neutrophils Absolute: 4 10*3/uL (ref 1.4–7.0)
Neutrophils: 57 %
Platelets: 254 10*3/uL (ref 150–450)
RBC: 4.61 x10E6/uL (ref 3.77–5.28)
RDW: 13.2 % (ref 11.7–15.4)
WBC: 7 10*3/uL (ref 3.4–10.8)

## 2023-03-21 LAB — LIPID PANEL
Chol/HDL Ratio: 3 ratio (ref 0.0–4.4)
Cholesterol, Total: 160 mg/dL (ref 100–199)
HDL: 54 mg/dL (ref >39)
LDL Chol Calc (NIH): 91 mg/dL (ref 0–99)
Triglycerides: 82 mg/dL (ref 0–149)
VLDL Cholesterol Cal: 15 mg/dL (ref 5–40)

## 2023-03-21 MED ORDER — LISINOPRIL 20 MG PO TABS: 20.0000 mg | ORAL_TABLET | Freq: Every day | ORAL | 1 refills | Status: DC

## 2023-03-21 MED ORDER — ATORVASTATIN CALCIUM 40 MG PO TABS: 40.0000 mg | ORAL_TABLET | Freq: Every day | ORAL | 1 refills | Status: DC

## 2023-03-21 NOTE — Patient Instructions (Signed)
Exercising to Stay Healthy To become healthy and stay healthy, it is recommended that you do moderate-intensity and vigorous-intensity exercise. You can tell that you are exercising at a moderate intensity if your heart starts beating faster and you start breathing faster but can still hold a conversation. You can tell that you are exercising at a vigorous intensity if you are breathing much harder and faster and cannot hold a conversation while exercising. How can exercise benefit me? Exercising regularly is important. It has many health benefits, such as: Improving overall fitness, flexibility, and endurance. Increasing bone density. Helping with weight control. Decreasing body fat. Increasing muscle strength and endurance. Reducing stress and tension, anxiety, depression, or anger. Improving overall health. What guidelines should I follow while exercising? Before you start a new exercise program, talk with your health care provider. Do not exercise so much that you hurt yourself, feel dizzy, or get very short of breath. Wear comfortable clothes and wear shoes with good support. Drink plenty of water while you exercise to prevent dehydration or heat stroke. Work out until your breathing and your heartbeat get faster (moderate intensity). How often should I exercise? Choose an activity that you enjoy, and set realistic goals. Your health care provider can help you make an activity plan that is individually designed and works best for you. Exercise regularly as told by your health care provider. This may include: Doing strength training two times a week, such as: Lifting weights. Using resistance bands. Push-ups. Sit-ups. Yoga. Doing a certain intensity of exercise for a given amount of time. Choose from these options: A total of 150 minutes of moderate-intensity exercise every week. A total of 75 minutes of vigorous-intensity exercise every week. A mix of moderate-intensity and  vigorous-intensity exercise every week. Children, pregnant women, people who have not exercised regularly, people who are overweight, and older adults may need to talk with a health care provider about what activities are safe to perform. If you have a medical condition, be sure to talk with your health care provider before you start a new exercise program. What are some exercise ideas? Moderate-intensity exercise ideas include: Walking 1 mile (1.6 km) in about 15 minutes. Biking. Hiking. Golfing. Dancing. Water aerobics. Vigorous-intensity exercise ideas include: Walking 4.5 miles (7.2 km) or more in about 1 hour. Jogging or running 5 miles (8 km) in about 1 hour. Biking 10 miles (16.1 km) or more in about 1 hour. Lap swimming. Roller-skating or in-line skating. Cross-country skiing. Vigorous competitive sports, such as football, basketball, and soccer. Jumping rope. Aerobic dancing. What are some everyday activities that can help me get exercise? Yard work, such as: Child psychotherapist. Raking and bagging leaves. Washing your car. Pushing a stroller. Shoveling snow. Gardening. Washing windows or floors. How can I be more active in my day-to-day activities? Use stairs instead of an elevator. Take a walk during your lunch break. If you drive, park your car farther away from your work or school. If you take public transportation, get off one stop early and walk the rest of the way. Stand up or walk around during all of your indoor phone calls. Get up, stretch, and walk around every 30 minutes throughout the day. Enjoy exercise with a friend. Support to continue exercising will help you keep a regular routine of activity. Where to find more information You can find more information about exercising to stay healthy from: U.S. Department of Health and Human Services: ThisPath.fi Centers for Disease Control and Prevention (  CDC): FootballExhibition.com.br Summary Exercising regularly is  important. It will improve your overall fitness, flexibility, and endurance. Regular exercise will also improve your overall health. It can help you control your weight, reduce stress, and improve your bone density. Do not exercise so much that you hurt yourself, feel dizzy, or get very short of breath. Before you start a new exercise program, talk with your health care provider. This information is not intended to replace advice given to you by your health care provider. Make sure you discuss any questions you have with your health care provider. Document Revised: 10/10/2020 Document Reviewed: 10/10/2020 Elsevier Patient Education  2024 [employer].

## 2023-03-21 NOTE — Progress Notes (Signed)
Subjective:    Patient ID: Erica [name], female    DOB: 06-02-1951, 72 y.o.   MRN: [id number]   Chief Complaint: medical management of chronic issues     HPI:  Erica [name] is a 72 y.o. who identifies as a female who was assigned female at birth.   Social history: Lives with: by herself Work history: retired   Water engineer in today for follow up of the following chronic medical issues:  1. Hyperlipidemia with target LDL less than 100 Does watch diet and plays golf  several times a week. Lab Results  Component Value Date   CHOL 179 09/20/2022   HDL 50 09/20/2022   LDLCALC 100 (H) 09/20/2022   TRIG 168 (H) 09/20/2022   CHOLHDL 3.6 09/20/2022     2. Gastroesophageal reflux disease without esophagitis Uses OTC meds when needed.  3. Osteopenia of lumbar spine Last dexascan was done on 03/22/22. T score was -15  4. BMI 27.0-27.9,adult No recent weight changes Wt Readings from Last 3 Encounters:  03/21/23 168 lb (76.2 kg)  11/05/22 169 lb (76.7 kg)  10/04/22 156 lb (70.8 kg)    BMI Readings from Last 3 Encounters:  03/21/23 28.84 kg/m  11/05/22 29.01 kg/m  10/04/22 26.78 kg/m      New complaints: None today  Allergies  Allergen Reactions   Povidone-Iodine Rash    Experienced are betadine was left on an area for and extended period.   Statins     Myalgias - Is now able to tolerate as long as she takees Co-Q-10   Outpatient Encounter Medications as of 03/21/2023  Medication Sig   atorvastatin (LIPITOR) 40 MG tablet Take 1 tablet (40 mg total) by mouth daily. (Patient not taking: Reported on 11/05/2022)   calcium carbonate (OSCAL) 1500 (600 Ca) MG TABS tablet Take by mouth daily with breakfast.   cholecalciferol (VITAMIN D3) 25 MCG (1000 UT) tablet Take 1,000 Units by mouth daily.   Coenzyme Q10 (CO Q-10) 200 MG CAPS Take by mouth.   fluticasone (FLONASE) 50 MCG/ACT nasal spray Place 2 sprays into both nostrils daily.   lisinopril (ZESTRIL) 20 MG tablet  Take 1 tablet (20 mg total) by mouth daily. (NEEDS TO BE SEEN BEFORE NEXT REFILL)   meclizine (ANTIVERT) 25 MG tablet Take 1 tablet (25 mg total) by mouth 3 (three) times daily as needed for dizziness.   Multiple Vitamins-Minerals (CENTRUM SILVER 50+WOMEN PO) Take 1 capsule by mouth daily.   No facility-administered encounter medications on file as of 03/21/2023.    Past Surgical History:  Procedure Laterality Date   CHOLECYSTECTOMY N/A 05/14/2014   Procedure: LAPAROSCOPIC CHOLECYSTECTOMY WITH INTRAOPERATIVE CHOLANGIOGRAM;  Surgeon: Atilano Ina, MD;  Location: [facility] OR;  Service: General;  Laterality: N/A;   NECK SURGERY     TONSILECTOMY/ADENOIDECTOMY WITH MYRINGOTOMY     TUBAL LIGATION      Family History  Problem Relation Age of Onset   Heart disease Father    Stroke Sister    Osteoporosis Mother    Heart attack Brother    Arthritis Daughter       Controlled substance contract: n/a     Review of Systems  Constitutional:  Negative for diaphoresis.  Eyes:  Negative for pain.  Respiratory:  Negative for shortness of breath.   Cardiovascular:  Negative for chest pain, palpitations and leg swelling.  Gastrointestinal:  Negative for abdominal pain.  Endocrine: Negative for polydipsia.  Skin:  Negative for rash.  Neurological:  Negative for dizziness, weakness and headaches.  Hematological:  Does not bruise/bleed easily.  All other systems reviewed and are negative.      Objective:   Physical Exam Vitals and nursing note reviewed.  Constitutional:      General: She is not in acute distress.    Appearance: Normal appearance. She is well-developed.  HENT:     Head: Normocephalic.     Right Ear: Tympanic membrane normal.     Left Ear: Tympanic membrane normal.     Nose: Nose normal.     Mouth/Throat:     Mouth: Mucous membranes are moist.  Eyes:     Pupils: Pupils are equal, round, and reactive to light.  Neck:     Vascular: No carotid bruit or JVD.   Cardiovascular:     Rate and Rhythm: Normal rate and regular rhythm.     Heart sounds: Normal heart sounds.  Pulmonary:     Effort: Pulmonary effort is normal. No respiratory distress.     Breath sounds: Normal breath sounds. No wheezing or rales.  Chest:     Chest wall: No tenderness.  Abdominal:     General: Bowel sounds are normal. There is no distension or abdominal bruit.     Palpations: Abdomen is soft. There is no hepatomegaly, splenomegaly, mass or pulsatile mass.     Tenderness: There is no abdominal tenderness.  Musculoskeletal:        General: Normal range of motion.     Cervical back: Normal range of motion and neck supple.  Lymphadenopathy:     Cervical: No cervical adenopathy.  Skin:    General: Skin is warm and dry.  Neurological:     Mental Status: She is alert and oriented to person, place, and time.     Deep Tendon Reflexes: Reflexes are normal and symmetric.  Psychiatric:        Behavior: Behavior normal.        Thought Content: Thought content normal.        Judgment: Judgment normal.     BP 138/78   Pulse 66   Temp 97.7 F (36.5 C) (Temporal)   Resp 20   Ht 5\' 4"  (1.626 m)   Wt 168 lb (76.2 kg)   SpO2 96%   BMI 28.84 kg/m        Assessment & Plan:   Erica [name] comes in today with chief complaint of Medical Management of Chronic Issues   Diagnosis and orders addressed:  1. Hyperlipidemia with target LDL less than 100 Low fat diet - CBC with Differential/Platelet - CMP14+EGFR - Lipid panel  2. Gastroesophageal reflux disease without esophagitis Avoid spicy foods Do not eat 2 hours prior to bedtime   3. Osteopenia of lumbar spine Weight bearing exercise  4. BMI 27.0-27.9,adult Discussed diet and exercise for person with BMI >25 Will recheck weight in 3-6 months    Labs pending Health Maintenance reviewed Diet and exercise encouraged  Follow up plan: 6 months   Mary-Margaret Daphine Deutscher, FNP

## 2023-03-23 DIAGNOSIS — D485 Neoplasm of uncertain behavior of skin: Secondary | ICD-10-CM | POA: Diagnosis not present

## 2023-03-23 DIAGNOSIS — Z1283 Encounter for screening for malignant neoplasm of skin: Secondary | ICD-10-CM | POA: Diagnosis not present

## 2023-03-23 DIAGNOSIS — Z85828 Personal history of other malignant neoplasm of skin: Secondary | ICD-10-CM | POA: Diagnosis not present

## 2023-05-05 DIAGNOSIS — Z1231 Encounter for screening mammogram for malignant neoplasm of breast: Secondary | ICD-10-CM | POA: Diagnosis not present

## 2023-05-05 LAB — HM MAMMOGRAPHY

## 2023-05-12 ENCOUNTER — Encounter: Payer: Self-pay | Admitting: Nurse Practitioner

## 2023-09-07 ENCOUNTER — Other Ambulatory Visit: Payer: Self-pay | Admitting: Nurse Practitioner

## 2023-09-07 DIAGNOSIS — H6992 Unspecified Eustachian tube disorder, left ear: Secondary | ICD-10-CM

## 2023-09-15 ENCOUNTER — Ambulatory Visit: Payer: Medicare HMO | Admitting: Nurse Practitioner

## 2023-09-15 ENCOUNTER — Encounter: Payer: Self-pay | Admitting: Nurse Practitioner

## 2023-09-15 VITALS — BP 136/62 | HR 76 | Temp 98.0°F | Ht 63.5 in | Wt 167.8 lb

## 2023-09-15 DIAGNOSIS — M8588 Other specified disorders of bone density and structure, other site: Secondary | ICD-10-CM

## 2023-09-15 DIAGNOSIS — E785 Hyperlipidemia, unspecified: Secondary | ICD-10-CM | POA: Diagnosis not present

## 2023-09-15 DIAGNOSIS — Z0001 Encounter for general adult medical examination with abnormal findings: Secondary | ICD-10-CM

## 2023-09-15 DIAGNOSIS — Z6827 Body mass index (BMI) 27.0-27.9, adult: Secondary | ICD-10-CM

## 2023-09-15 DIAGNOSIS — K219 Gastro-esophageal reflux disease without esophagitis: Secondary | ICD-10-CM

## 2023-09-15 DIAGNOSIS — Z0000 Encounter for general adult medical examination without abnormal findings: Secondary | ICD-10-CM

## 2023-09-15 LAB — LIPID PANEL

## 2023-09-15 MED ORDER — ATORVASTATIN CALCIUM 40 MG PO TABS: 40.0000 mg | ORAL_TABLET | Freq: Every day | ORAL | 1 refills | Status: DC

## 2023-09-15 NOTE — Patient Instructions (Signed)
 Exercising to Stay Healthy To become healthy and stay healthy, it is recommended that you do moderate-intensity and vigorous-intensity exercise. You can tell that you are exercising at a moderate intensity if your heart starts beating faster and you start breathing faster but can still hold a conversation. You can tell that you are exercising at a vigorous intensity if you are breathing much harder and faster and cannot hold a conversation while exercising. How can exercise benefit me? Exercising regularly is important. It has many health benefits, such as: Improving overall fitness, flexibility, and endurance. Increasing bone density. Helping with weight control. Decreasing body fat. Increasing muscle strength and endurance. Reducing stress and tension, anxiety, depression, or anger. Improving overall health. What guidelines should I follow while exercising? Before you start a new exercise program, talk with your health care provider. Do not exercise so much that you hurt yourself, feel dizzy, or get very short of breath. Wear comfortable clothes and wear shoes with good support. Drink plenty of water while you exercise to prevent dehydration or heat stroke. Work out until your breathing and your heartbeat get faster (moderate intensity). How often should I exercise? Choose an activity that you enjoy, and set realistic goals. Your health care provider can help you make an activity plan that is individually designed and works best for you. Exercise regularly as told by your health care provider. This may include: Doing strength training two times a week, such as: Lifting weights. Using resistance bands. Push-ups. Sit-ups. Yoga. Doing a certain intensity of exercise for a given amount of time. Choose from these options: A total of 150 minutes of moderate-intensity exercise every week. A total of 75 minutes of vigorous-intensity exercise every week. A mix of moderate-intensity and  vigorous-intensity exercise every week. Children, pregnant women, people who have not exercised regularly, people who are overweight, and older adults may need to talk with a health care provider about what activities are safe to perform. If you have a medical condition, be sure to talk with your health care provider before you start a new exercise program. What are some exercise ideas? Moderate-intensity exercise ideas include: Walking 1 mile (1.6 km) in about 15 minutes. Biking. Hiking. Golfing. Dancing. Water aerobics. Vigorous-intensity exercise ideas include: Walking 4.5 miles (7.2 km) or more in about 1 hour. Jogging or running 5 miles (8 km) in about 1 hour. Biking 10 miles (16.1 km) or more in about 1 hour. Lap swimming. Roller-skating or in-line skating. Cross-country skiing. Vigorous competitive sports, such as football, basketball, and soccer. Jumping rope. Aerobic dancing. What are some everyday activities that can help me get exercise? Yard work, such as: Child psychotherapist. Raking and bagging leaves. Washing your car. Pushing a stroller. Shoveling snow. Gardening. Washing windows or floors. How can I be more active in my day-to-day activities? Use stairs instead of an elevator. Take a walk during your lunch break. If you drive, park your car farther away from your work or school. If you take public transportation, get off one stop early and walk the rest of the way. Stand up or walk around during all of your indoor phone calls. Get up, stretch, and walk around every 30 minutes throughout the day. Enjoy exercise with a friend. Support to continue exercising will help you keep a regular routine of activity. Where to find more information You can find more information about exercising to stay healthy from: [place]. Department of Health and Human Services: ThisPath.fi Centers for Disease Control and Prevention (  CDC): [employer]Exhibition.com.br Summary Exercising regularly is  important. It will improve your overall fitness, flexibility, and endurance. Regular exercise will also improve your overall health. It can help you control your weight, reduce stress, and improve your bone density. Do not exercise so much that you hurt yourself, feel dizzy, or get very short of breath. Before you start a new exercise program, talk with your health care provider. This information is not intended to replace advice given to you by your health care provider. Make sure you discuss any questions you have with your health care provider. Document Revised: 10/10/2020 Document Reviewed: 10/10/2020 Elsevier Patient Education  2024 [employer].

## 2023-09-15 NOTE — Progress Notes (Signed)
 Subjective:    Patient ID: Erica [name], female    DOB: March 07, 1951, 73 y.o.   MRN: [id number]   Chief Complaint: annual physical     HPI:  Erica [name] is a 73 y.o. who identifies as a female who was assigned female at birth.   Social history: Lives with: by herself Work history: retired   Water engineer in today for follow up of the following chronic medical issues:  1. Annual physical   2. Hyperlipidemia with target LDL less than 100 Does watch diet and plays golf  several times a week. Lab Results  Component Value Date   CHOL 160 03/21/2023   HDL 54 03/21/2023   LDLCALC 91 03/21/2023   TRIG 82 03/21/2023   CHOLHDL 3.0 03/21/2023   The 10-year ASCVD risk score (Arnett DK, et al., 2019) is: 12.5%   3. Gastroesophageal reflux disease without esophagitis Uses OTC meds when needed.  4. Osteopenia of lumbar spine Last dexascan was done on 03/22/22. T score was -1.5  5. BMI 28.0-28.9,adult No recent weight changes Wt Readings from Last 3 Encounters:  09/15/23 167 lb 12.8 oz (76.1 kg)  03/21/23 168 lb (76.2 kg)  11/05/22 169 lb (76.7 kg)   BMI Readings from Last 3 Encounters:  09/15/23 29.26 kg/m  03/21/23 28.84 kg/m  11/05/22 29.01 kg/m         New complaints: None today  Allergies  Allergen Reactions   Povidone-Iodine Rash    Experienced are betadine was left on an area for and extended period.   Statins     Myalgias - Is now able to tolerate as long as she takees Co-Q-10   Outpatient Encounter Medications as of 09/15/2023  Medication Sig   atorvastatin (LIPITOR) 40 MG tablet Take 1 tablet (40 mg total) by mouth daily.   calcium carbonate (OSCAL) 1500 (600 Ca) MG TABS tablet Take by mouth daily with breakfast.   cholecalciferol (VITAMIN D3) 25 MCG (1000 UT) tablet Take 1,000 Units by mouth daily.   Coenzyme Q10 (CO Q-10) 200 MG CAPS Take by mouth.   fluticasone (FLONASE) 50 MCG/ACT nasal spray SPRAY 2 SPRAYS INTO EACH NOSTRIL EVERY DAY    lisinopril (ZESTRIL) 20 MG tablet Take 1 tablet (20 mg total) by mouth daily. (NEEDS TO BE SEEN BEFORE NEXT REFILL)   meclizine (ANTIVERT) 25 MG tablet Take 1 tablet (25 mg total) by mouth 3 (three) times daily as needed for dizziness.   Multiple Vitamins-Minerals (CENTRUM SILVER 50+WOMEN PO) Take 1 capsule by mouth daily.   No facility-administered encounter medications on file as of 09/15/2023.    Past Surgical History:  Procedure Laterality Date   CHOLECYSTECTOMY N/A 05/14/2014   Procedure: LAPAROSCOPIC CHOLECYSTECTOMY WITH INTRAOPERATIVE CHOLANGIOGRAM;  Surgeon: Atilano Ina, MD;  Location: [facility] OR;  Service: General;  Laterality: N/A;   NECK SURGERY     TONSILECTOMY/ADENOIDECTOMY WITH MYRINGOTOMY     TUBAL LIGATION      Family History  Problem Relation Age of Onset   Heart disease Father    Stroke Sister    Osteoporosis Mother    Heart attack Brother    Arthritis Daughter       Controlled substance contract: n/a     Review of Systems  Constitutional:  Negative for diaphoresis.  Eyes:  Negative for pain.  Respiratory:  Negative for shortness of breath.   Cardiovascular:  Negative for chest pain, palpitations and leg swelling.  Gastrointestinal:  Negative for abdominal pain.  Endocrine: Negative  for polydipsia.  Skin:  Negative for rash.  Neurological:  Negative for dizziness, weakness and headaches.  Hematological:  Does not bruise/bleed easily.  All other systems reviewed and are negative.      Objective:   Physical Exam Vitals and nursing note reviewed.  Constitutional:      General: She is not in acute distress.    Appearance: Normal appearance. She is well-developed.  HENT:     Head: Normocephalic.     Right Ear: Tympanic membrane normal.     Left Ear: Tympanic membrane normal.     Nose: Nose normal.     Mouth/Throat:     Mouth: Mucous membranes are moist.  Eyes:     Pupils: Pupils are equal, round, and reactive to light.  Neck:     Vascular: No  carotid bruit or JVD.  Cardiovascular:     Rate and Rhythm: Normal rate and regular rhythm.     Heart sounds: Normal heart sounds.  Pulmonary:     Effort: Pulmonary effort is normal. No respiratory distress.     Breath sounds: Normal breath sounds. No wheezing or rales.  Chest:     Chest wall: No tenderness.  Abdominal:     General: Bowel sounds are normal. There is no distension or abdominal bruit.     Palpations: Abdomen is soft. There is no hepatomegaly, splenomegaly, mass or pulsatile mass.     Tenderness: There is no abdominal tenderness.  Musculoskeletal:        General: Normal range of motion.     Cervical back: Normal range of motion and neck supple.  Lymphadenopathy:     Cervical: No cervical adenopathy.  Skin:    General: Skin is warm and dry.  Neurological:     Mental Status: She is alert and oriented to person, place, and time.     Deep Tendon Reflexes: Reflexes are normal and symmetric.  Psychiatric:        Behavior: Behavior normal.        Thought Content: Thought content normal.        Judgment: Judgment normal.     BP 136/62   Pulse 76   Temp 98 F (36.7 C)   Ht 5' 3.5" (1.613 m)   Wt 167 lb 12.8 oz (76.1 kg)   SpO2 94%   BMI 29.26 kg/m         Assessment & Plan:   Erica [name] comes in today with chief complaint of annual pohysical  Diagnosis and orders addressed:  1. Annual physical   2. Hyperlipidemia with target LDL less than 100 Low fat diet - CBC with Differential/Platelet - CMP14+EGFR - Lipid panel  3. Gastroesophageal reflux disease without esophagitis Avoid spicy foods Do not eat 2 hours prior to bedtime   4. Osteopenia of lumbar spine Weight bearing exercise  5. BMI 27.0-27.9,adult Discussed diet and exercise for person with BMI >25 Will recheck weight in 3-6 months    Labs pending Health Maintenance reviewed Diet and exercise encouraged  Follow up plan: 6 months   Mary-Margaret Daphine Deutscher, FNP

## 2023-09-16 LAB — CMP14+EGFR
ALT: 23 IU/L (ref 0–32)
AST: 26 IU/L (ref 0–40)
Albumin: 4.1 g/dL (ref 3.8–4.8)
Alkaline Phosphatase: 114 IU/L (ref 44–121)
BUN/Creatinine Ratio: 18 (ref 12–28)
BUN: 14 mg/dL (ref 8–27)
Bilirubin Total: 0.4 mg/dL (ref 0.0–1.2)
CO2: 24 mmol/L (ref 20–29)
Calcium: 9.6 mg/dL (ref 8.7–10.3)
Chloride: 102 mmol/L (ref 96–106)
Creatinine, Ser: 0.76 mg/dL (ref 0.57–1.00)
Globulin, Total: 2.5 g/dL (ref 1.5–4.5)
Glucose: 95 mg/dL (ref 70–99)
Potassium: 4.6 mmol/L (ref 3.5–5.2)
Sodium: 141 mmol/L (ref 134–144)
Total Protein: 6.6 g/dL (ref 6.0–8.5)
eGFR: 83 mL/min/{1.73_m2} (ref >59)

## 2023-09-16 LAB — LIPID PANEL
Cholesterol, Total: 155 mg/dL (ref 100–199)
HDL: 50 mg/dL (ref >39)
LDL CALC COMMENT:: 3.1 ratio (ref 0.0–4.4)
LDL Chol Calc (NIH): 85 mg/dL (ref 0–99)
Triglycerides: 110 mg/dL (ref 0–149)
VLDL Cholesterol Cal: 20 mg/dL (ref 5–40)

## 2023-09-16 LAB — CBC WITH DIFFERENTIAL/PLATELET
Basophils Absolute: 0.1 10*3/uL (ref 0.0–0.2)
Basos: 1 %
EOS (ABSOLUTE): 0.3 10*3/uL (ref 0.0–0.4)
Eos: 4 %
Hematocrit: 44.5 % (ref 34.0–46.6)
Hemoglobin: 14.4 g/dL (ref 11.1–15.9)
Immature Grans (Abs): 0 10*3/uL (ref 0.0–0.1)
Immature Granulocytes: 0 %
Lymphocytes Absolute: 1.9 10*3/uL (ref 0.7–3.1)
Lymphs: 25 %
MCH: 30.1 pg (ref 26.6–33.0)
MCHC: 32.4 g/dL (ref 31.5–35.7)
MCV: 93 fL (ref 79–97)
Monocytes Absolute: 0.9 10*3/uL (ref 0.1–0.9)
Monocytes: 11 %
Neutrophils Absolute: 4.5 10*3/uL (ref 1.4–7.0)
Neutrophils: 59 %
Platelets: 321 10*3/uL (ref 150–450)
RBC: 4.79 x10E6/uL (ref 3.77–5.28)
RDW: 13.2 % (ref 11.7–15.4)
WBC: 7.7 10*3/uL (ref 3.4–10.8)

## 2023-09-16 LAB — VITAMIN D 25 HYDROXY (VIT D DEFICIENCY, FRACTURES): Vit D, 25-Hydroxy: 82.3 ng/mL (ref 30.0–100.0)

## 2023-09-16 LAB — THYROID PANEL WITH TSH
Free Thyroxine Index: 2.3 (ref 1.2–4.9)
T3 Uptake Ratio: 29 % (ref 24–39)
T4, Total: 7.8 ug/dL (ref 4.5–12.0)
TSH: 3.72 u[IU]/mL (ref 0.450–4.500)

## 2023-09-25 ENCOUNTER — Other Ambulatory Visit: Payer: Self-pay | Admitting: Nurse Practitioner

## 2023-10-05 ENCOUNTER — Ambulatory Visit: Payer: Medicare HMO

## 2023-10-05 VITALS — BP 136/62 | HR 76 | Ht 63.0 in | Wt 167.0 lb

## 2023-10-05 DIAGNOSIS — Z0000 Encounter for general adult medical examination without abnormal findings: Secondary | ICD-10-CM | POA: Diagnosis not present

## 2023-10-05 NOTE — Progress Notes (Signed)
 Subjective:   Erica [name] is a 73 y.o. who presents for a Medicare Wellness preventive visit.  Visit Complete: Virtual I connected with  Erica [name] on 10/05/23 by a audio enabled telemedicine application and verified that I am speaking with the correct person using two identifiers.  Patient Location: Home  Provider Location: Home Office  I discussed the limitations of evaluation and management by telemedicine. The patient expressed understanding and agreed to proceed.  Vital Signs: Because this visit was a virtual/telehealth visit, some criteria may be missing or patient reported. Any vitals not documented were not able to be obtained and vitals that have been documented are patient reported.  VideoDeclined- This patient declined Librarian, academic. Therefore the visit was completed with audio only.  Persons Participating in Visit: Patient.  AWV Questionnaire: Yes: Patient Medicare AWV questionnaire was completed by the patient on 10/03/23; I have confirmed that all information answered by patient is correct and no changes since this date.  Cardiac Risk Factors include: advanced age (>75men, >45 women);dyslipidemia     Objective:    Today's Vitals   10/05/23 0825  BP: 136/62  Pulse: 76  Weight: 167 lb (75.8 kg)  Height: 5\' 3"  (1.6 m)   Body mass index is 29.58 kg/m.     10/05/2023    8:07 AM 10/04/2022    8:20 AM 09/30/2021    8:11 AM 09/29/2020    8:21 AM 09/27/2019    8:39 AM 12/20/2017    2:54 PM 05/09/2014    8:35 AM  Advanced Directives  Does Patient Have a Medical Advance Directive? Yes Yes Yes Yes Yes Yes No  Type of Estate agent of [place];Living will Healthcare Power of [place];Living will Healthcare Power of [place];Living will Healthcare Power of [place];Living will Healthcare Power of [place];Living will;Out of facility DNR (pink MOST or yellow form)    Copy of Healthcare Power of Attorney in Chart? No -  copy requested No - copy requested No - copy requested No - copy requested     Would patient like information on creating a medical advance directive?       Yes - Educational materials given    Current Medications (verified) Outpatient Encounter Medications as of 10/05/2023  Medication Sig   atorvastatin (LIPITOR) 40 MG tablet Take 1 tablet (40 mg total) by mouth daily.   calcium carbonate (OSCAL) 1500 (600 Ca) MG TABS tablet Take by mouth daily with breakfast.   cholecalciferol (VITAMIN D3) 25 MCG (1000 UT) tablet Take 1,000 Units by mouth daily.   Coenzyme Q10 (CO Q-10) 200 MG CAPS Take by mouth.   fluticasone (FLONASE) 50 MCG/ACT nasal spray SPRAY 2 SPRAYS INTO EACH NOSTRIL EVERY DAY   lisinopril (ZESTRIL) 20 MG tablet TAKE 1 TABLET (20 MG TOTAL) BY MOUTH DAILY. (NEEDS TO BE SEEN BEFORE NEXT REFILL)   meclizine (ANTIVERT) 25 MG tablet Take 1 tablet (25 mg total) by mouth 3 (three) times daily as needed for dizziness.   Multiple Vitamins-Minerals (CENTRUM SILVER 50+WOMEN PO) Take 1 capsule by mouth daily.   No facility-administered encounter medications on file as of 10/05/2023.    Allergies (verified) Povidone-iodine and Statins   History: Past Medical History:  Diagnosis Date   Complication of anesthesia    Hyperlipidemia    Osteopenia    Past Surgical History:  Procedure Laterality Date   CHOLECYSTECTOMY N/A 05/14/2014   Procedure: LAPAROSCOPIC CHOLECYSTECTOMY WITH INTRAOPERATIVE CHOLANGIOGRAM;  Surgeon: Atilano Ina, MD;  Location: [facility] OR;  Service: General;  Laterality: N/A;   NECK SURGERY     TONSILECTOMY/ADENOIDECTOMY WITH MYRINGOTOMY     TUBAL LIGATION     Family History  Problem Relation Age of Onset   Heart disease Father    Stroke Sister    Osteoporosis Mother    Heart attack Brother    Arthritis Daughter    Social History   Socioeconomic History   Marital status: Divorced    Spouse name: Not on file   Number of children: 3   Years of education: ged    Highest education level: GED or equivalent  Occupational History   Occupation: Designer, fashion/clothing    Comment: retired  Tobacco Use   Smoking status: Former    Current packs/day: 0.00    Average packs/day: 1 pack/day for 40.0 years (40.0 ttl pk-yrs)    Types: Cigarettes    Start date: 06/29/1967    Quit date: 06/29/2007    Years since quitting: 16.2   Smokeless tobacco: Never  Vaping Use   Vaping status: Never Used  Substance and Sexual Activity   Alcohol use: Yes    Alcohol/week: 1.0 standard drink of alcohol    Types: 1 Glasses of wine per week    Comment: rarely   Drug use: No   Sexual activity: Not on file  Other Topics Concern   Not on file  Social History Narrative   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 She is divorced and lives alone. She has 2 daughters and 1 son. She sees her children regularly. She enjoys golf and gardening.    Social Drivers of Corporate investment banker Strain: Low Risk  (10/05/2023)   Overall Financial Resource Strain (CARDIA)    Difficulty of Paying Living Expenses: Not hard at all  Food Insecurity: No Food Insecurity (10/05/2023)   Hunger Vital Sign    Worried About Running Out of Food in the Last Year: Never true    Ran Out of Food in the Last Year: Never true  Transportation Needs: No Transportation Needs (10/05/2023)   PRAPARE - Administrator, Civil Service (Medical): No    Lack of Transportation (Non-Medical): No  Physical Activity: Sufficiently Active (10/05/2023)   Exercise Vital Sign    Days of Exercise per Week: 3 days    Minutes of Exercise per Session: 140 min  Recent Concern: Physical Activity - Insufficiently Active (09/14/2023)   Exercise Vital Sign    Days of Exercise per Week: 2 days    Minutes of Exercise per Session: 10 min  Stress: Stress Concern Present (09/14/2023)   [employer] of Occupational Health - Occupational Stress Questionnaire    Feeling of Stress : To some extent  Social Connections: Socially Isolated (10/05/2023)    Social Connection and Isolation Panel [NHANES]    Frequency of Communication with Friends and Family: More than three times a week    Frequency of Social Gatherings with Friends and Family: More than three times a week    Attends Religious Services: Never    Database administrator or Organizations: No    Attends Engineer, structural: Never    Marital Status: Divorced    Tobacco Counseling Counseling given: Yes    Clinical Intake:           BMI - recorded: 29.58 Nutritional Status: BMI 25 -29 Overweight Nutritional Risks: None Diabetes: No  No results found for: "HGBA1C"   How often do you need to  have someone help you when you read instructions, pamphlets, or other written materials from your doctor or pharmacy?: 1 - Never  Interpreter Needed?: No  Information entered by :: Zane Herald T/CMA   Activities of Daily Living     10/03/2023    8:55 AM  In your present state of health, do you have any difficulty performing the following activities:  Hearing? 0  Vision? 0  Difficulty concentrating or making decisions? 0  Walking or climbing stairs? 0  Dressing or bathing? 0  Doing errands, shopping? 0  Preparing Food and eating ? N  Using the Toilet? N  In the past six months, have you accidently leaked urine? N  Do you have problems with loss of bowel control? N  Managing your Medications? N  Managing your Finances? N  Housekeeping or managing your Housekeeping? N    Patient Care Team: Bennie Pierini, FNP as PCP - General (Nurse Practitioner) Marcelino Duster, MD as Referring Physician (Dermatology) Delora Fuel, OD (Optometry)  Indicate any recent Medical Services you may have received from other than [facility] providers in the past year (date may be approximate).     Assessment:   This is a routine wellness examination for Tris.  Hearing/Vision screen Hearing Screening - Comments:: Pt denies hearing def Vision Screening - Comments:: Pt denies  vision def Pt goes [facility]   Goals Addressed   None    Depression Screen     10/05/2023    8:07 AM 09/15/2023    8:08 AM 03/21/2023    8:00 AM 11/05/2022   10:08 AM 10/04/2022    8:19 AM 09/20/2022   10:12 AM 03/22/2022    9:47 AM  PHQ 2/9 Scores  PHQ - 2 Score 0 0 0 0 0 0 0  PHQ- 9 Score 2 2 0 0 0 0 1    Fall Risk     10/03/2023    8:55 AM 03/21/2023    8:00 AM 11/05/2022   10:08 AM 10/04/2022    8:18 AM 09/30/2022    6:50 PM  Fall Risk   Falls in the past year? 1 0 0 0 0  Number falls in past yr: 0   0 0  Injury with Fall? 0   0 0  Risk for fall due to :    No Fall Risks   Follow up    Falls prevention discussed     MEDICARE RISK AT HOME:  Medicare Risk at Home Any stairs in or around the home?: (Patient-Rptd) Yes If so, are there any without handrails?: (Patient-Rptd) No Home free of loose throw rugs in walkways, pet beds, electrical cords, etc?: (Patient-Rptd) Yes Adequate lighting in your home to reduce risk of falls?: (Patient-Rptd) Yes Life alert?: (Patient-Rptd) No Use of a cane, walker or w/c?: (Patient-Rptd) No Grab bars in the bathroom?: (Patient-Rptd) Yes Shower chair or bench in shower?: (Patient-Rptd) No Elevated toilet seat or a handicapped toilet?: (Patient-Rptd) Yes  TIMED UP AND GO:  Was the test performed?  No  Cognitive Function: 6CIT completed        10/05/2023    8:17 AM 10/04/2022    8:21 AM 09/30/2021    8:12 AM 09/27/2019    8:41 AM  6CIT Screen  What Year? 0 points 0 points 0 points 0 points  What month? 0 points 0 points 0 points 0 points  What time? 0 points 0 points 0 points 0 points  Count back from 20 0 points  0 points 0 points 0 points  Months in reverse 0 points 0 points 0 points 0 points  Repeat phrase 0 points 0 points 0 points 0 points  Total Score 0 points 0 points 0 points 0 points    Immunizations Immunization History  Administered Date(s) Administered   Fluad Quad(high Dose 65+) 03/13/2019, 03/18/2020, 03/20/2021    Fluad Trivalent(High Dose 65+) 03/21/2023   Influenza Split 04/17/2013   Influenza, High Dose Seasonal PF 04/12/2017, 04/17/2018   Influenza-Unspecified 04/16/2014, 04/08/2015, 04/06/2016   Moderna SARS-COV2 Booster Vaccination 05/15/2020   Moderna Sars-Covid-2 Vaccination 08/03/2019, 08/31/2019   Pneumococcal Conjugate-13 03/10/2018   Pneumococcal Polysaccharide-23 03/13/2019   Tdap 01/26/2019   Zoster Recombinant(Shingrix) 03/20/2021, 03/22/2022   Zoster, Live 04/21/2011    Screening Tests Health Maintenance  Topic Date Due   Colonoscopy  05/17/2023   COVID-19 Vaccine (4 - 2024-25 season) 10/20/2024 (Originally 02/27/2023)   INFLUENZA VACCINE  01/27/2024   DEXA SCAN  03/24/2024   MAMMOGRAM  05/04/2024   Medicare Annual Wellness (AWV)  10/04/2024   DTaP/Tdap/Td (2 - Td or Tdap) 01/25/2029   Pneumonia Vaccine 1+ Years old  Completed   Hepatitis C Screening  Completed   Zoster Vaccines- Shingrix  Completed   HPV VACCINES  Aged Out    Health Maintenance  Health Maintenance Due  Topic Date Due   Colonoscopy  05/17/2023   Health Maintenance Items Addressed: See Nurse Notes  Additional Screening:  Vision Screening: Recommended annual ophthalmology exams for early detection of glaucoma and other disorders of the eye.  Dental Screening: Recommended annual dental exams for proper oral hygiene  Community Resource Referral / Chronic Care Management: CRR required this visit?  No   CCM required this visit?  No     Plan:     I have personally reviewed and noted the following in the patient's chart:   Medical and social history Use of alcohol, tobacco or illicit drugs  Current medications and supplements including opioid prescriptions. Patient is not currently taking opioid prescriptions. Functional ability and status Nutritional status Physical activity Advanced directives List of other physicians Hospitalizations, surgeries, and ER visits in previous 12  months Vitals Screenings to include cognitive, depression, and falls Referrals and appointments  In addition, I have reviewed and discussed with patient certain preventive protocols, quality metrics, and best practice recommendations. A written personalized care plan for preventive services as well as general preventive health recommendations were provided to patient.     Arta Silence, CMA   10/05/2023   After Visit Summary: (MyChart) Due to this being a telephonic visit, the after visit summary with patients personalized plan was offered to patient via MyChart   Notes:  Pt is aware that Colonoscopy is coming up due. Msg pcp

## 2023-10-05 NOTE — Patient Instructions (Signed)
 Erica [name] , Thank you for taking time to come for your Medicare Wellness Visit. I appreciate your ongoing commitment to your health goals. Please review the following plan we discussed and let me know if I can assist you in the future.   Referrals/Orders/Follow-Ups/Clinician Recommendations: Please discuss with your provider regarding your colonoscopy, which has been passed due at your next visit.   This is a list of the screening recommended for you and due dates:  Health Maintenance  Topic Date Due   Colon Cancer Screening  05/17/2023   COVID-19 Vaccine (4 - 2024-25 season) 10/20/2024*   Flu Shot  01/27/2024   DEXA scan (bone density measurement)  03/24/2024   Mammogram  05/04/2024   Medicare Annual Wellness Visit  10/04/2024   DTaP/Tdap/Td vaccine (2 - Td or Tdap) 01/25/2029   Pneumonia Vaccine  Completed   Hepatitis C Screening  Completed   Zoster (Shingles) Vaccine  Completed   HPV Vaccine  Aged Out  *Topic was postponed. The date shown is not the original due date.    Advanced directives: (Copy Requested) Please bring a copy of your health care power of attorney and living will to the office to be added to your chart at your convenience. You can mail to [facility] [address] [place], [place] [address] or email to [email or web address]  Next Medicare Annual Wellness Visit scheduled for next year: Yes

## 2023-12-15 ENCOUNTER — Encounter: Payer: Self-pay | Admitting: *Deleted

## 2024-03-12 ENCOUNTER — Ambulatory Visit (INDEPENDENT_AMBULATORY_CARE_PROVIDER_SITE_OTHER): Admitting: Nurse Practitioner

## 2024-03-12 ENCOUNTER — Encounter: Payer: Self-pay | Admitting: Nurse Practitioner

## 2024-03-12 VITALS — BP 132/66 | HR 65 | Temp 97.6°F | Ht 63.0 in | Wt 164.0 lb

## 2024-03-12 DIAGNOSIS — K219 Gastro-esophageal reflux disease without esophagitis: Secondary | ICD-10-CM | POA: Diagnosis not present

## 2024-03-12 DIAGNOSIS — Z23 Encounter for immunization: Secondary | ICD-10-CM

## 2024-03-12 DIAGNOSIS — R42 Dizziness and giddiness: Secondary | ICD-10-CM | POA: Diagnosis not present

## 2024-03-12 DIAGNOSIS — M8588 Other specified disorders of bone density and structure, other site: Secondary | ICD-10-CM | POA: Diagnosis not present

## 2024-03-12 DIAGNOSIS — Z6827 Body mass index (BMI) 27.0-27.9, adult: Secondary | ICD-10-CM

## 2024-03-12 DIAGNOSIS — Z1211 Encounter for screening for malignant neoplasm of colon: Secondary | ICD-10-CM

## 2024-03-12 DIAGNOSIS — E785 Hyperlipidemia, unspecified: Secondary | ICD-10-CM

## 2024-03-12 LAB — CBC WITH DIFFERENTIAL/PLATELET
Basophils Absolute: 0.1 x10E3/uL (ref 0.0–0.2)
Basos: 1 %
EOS (ABSOLUTE): 0.2 x10E3/uL (ref 0.0–0.4)
Eos: 2 %
Hematocrit: 44.8 % (ref 34.0–46.6)
Hemoglobin: 14.7 g/dL (ref 11.1–15.9)
Immature Grans (Abs): 0 x10E3/uL (ref 0.0–0.1)
Immature Granulocytes: 0 %
Lymphocytes Absolute: 2.1 x10E3/uL (ref 0.7–3.1)
Lymphs: 29 %
MCH: 31.5 pg (ref 26.6–33.0)
MCHC: 32.8 g/dL (ref 31.5–35.7)
MCV: 96 fL (ref 79–97)
Monocytes Absolute: 0.7 x10E3/uL (ref 0.1–0.9)
Monocytes: 9 %
Neutrophils Absolute: 4.2 x10E3/uL (ref 1.4–7.0)
Neutrophils: 59 %
Platelets: 259 x10E3/uL (ref 150–450)
RBC: 4.66 x10E6/uL (ref 3.77–5.28)
RDW: 13.1 % (ref 11.7–15.4)
WBC: 7.2 x10E3/uL (ref 3.4–10.8)

## 2024-03-12 MED ORDER — MECLIZINE HCL 25 MG PO TABS: 25.0000 mg | ORAL_TABLET | Freq: Three times a day (TID) | ORAL | 0 refills | Status: AC | PRN

## 2024-03-12 MED ORDER — ATORVASTATIN CALCIUM 40 MG PO TABS: 40.0000 mg | ORAL_TABLET | Freq: Every day | ORAL | 1 refills | Status: AC

## 2024-03-12 NOTE — Addendum Note (Signed)
 Addended by: VIKTORIA ALAN MATSU on: 03/12/2024 09:15 AM   Modules accepted: Orders

## 2024-03-12 NOTE — Progress Notes (Signed)
 Subjective:    Patient ID: Erica [name], female    DOB: Jul 21, 1950, 73 y.o.   MRN: [id number]   Chief Complaint: medical management of chronic issues     HPI:  Erica [name] is a 73 y.o. who identifies as a female who was assigned female at birth.   Social history: Lives with: by herself Work history: retired   Water engineer in today for follow up of the following chronic medical issues:  1. Hyperlipidemia with target LDL less than 100 Does watch diet and plays golf  several times a week. Lab Results  Component Value Date   CHOL 155 09/15/2023   HDL 50 09/15/2023   LDLCALC 85 09/15/2023   TRIG 110 09/15/2023   CHOLHDL 3.1 09/15/2023   The 10-year ASCVD risk score (Arnett DK, et al., 2019) is: 11.9%   2. Gastroesophageal reflux disease without esophagitis Uses OTC meds when needed.  3. Osteopenia of lumbar spine Last dexascan was done on 03/22/22. T score was -1.5   4. BMI 27.0-27.9,adult Weight is down 3 lbs  Wt Readings from Last 3 Encounters:  03/12/24 164 lb (74.4 kg)  10/05/23 167 lb (75.8 kg)  09/15/23 167 lb 12.8 oz (76.1 kg)   BMI Readings from Last 3 Encounters:  03/12/24 29.05 kg/m  10/05/23 29.58 kg/m  09/15/23 29.26 kg/m     New complaints: None today  Allergies  Allergen Reactions   Povidone-Iodine Rash    Experienced are betadine was left on an area for and extended period.   Statins     Myalgias - Is now able to tolerate as long as she takees Co-Q-10   Outpatient Encounter Medications as of 03/12/2024  Medication Sig   atorvastatin  (LIPITOR) 40 MG tablet Take 1 tablet (40 mg total) by mouth daily.   calcium  carbonate (OSCAL) 1500 (600 Ca) MG TABS tablet Take by mouth daily with breakfast.   cholecalciferol (VITAMIN D3) 25 MCG (1000 UT) tablet Take 1,000 Units by mouth daily.   Coenzyme Q10 (CO Q-10) 200 MG CAPS Take by mouth.   fluticasone  (FLONASE ) 50 MCG/ACT nasal spray SPRAY 2 SPRAYS INTO EACH NOSTRIL EVERY DAY   lisinopril   (ZESTRIL ) 20 MG tablet TAKE 1 TABLET (20 MG TOTAL) BY MOUTH DAILY. (NEEDS TO BE SEEN BEFORE NEXT REFILL)   meclizine  (ANTIVERT ) 25 MG tablet Take 1 tablet (25 mg total) by mouth 3 (three) times daily as needed for dizziness.   Multiple Vitamins-Minerals (CENTRUM SILVER 50+WOMEN PO) Take 1 capsule by mouth daily.   No facility-administered encounter medications on file as of 03/12/2024.    Past Surgical History:  Procedure Laterality Date   CHOLECYSTECTOMY N/A 05/14/2014   Procedure: LAPAROSCOPIC CHOLECYSTECTOMY WITH INTRAOPERATIVE CHOLANGIOGRAM;  Surgeon: Camellia CHRISTELLA Blush, MD;  Location: [facility] OR;  Service: General;  Laterality: N/A;   NECK SURGERY     TONSILECTOMY/ADENOIDECTOMY WITH MYRINGOTOMY     TUBAL LIGATION      Family History  Problem Relation Age of Onset   Heart disease Father    Stroke Sister    Osteoporosis Mother    Heart attack Brother    Arthritis Daughter       Controlled substance contract: n/a     Review of Systems  Constitutional:  Negative for diaphoresis.  Eyes:  Negative for pain.  Respiratory:  Negative for shortness of breath.   Cardiovascular:  Negative for chest pain, palpitations and leg swelling.  Gastrointestinal:  Negative for abdominal pain.  Endocrine: Negative for polydipsia.  Skin:  Negative for rash.  Neurological:  Negative for dizziness, weakness and headaches.  Hematological:  Does not bruise/bleed easily.  All other systems reviewed and are negative.      Objective:   Physical Exam Vitals and nursing note reviewed.  Constitutional:      General: She is not in acute distress.    Appearance: Normal appearance. She is well-developed.  HENT:     Head: Normocephalic.     Right Ear: Tympanic membrane normal.     Left Ear: Tympanic membrane normal.     Nose: Nose normal.     Mouth/Throat:     Mouth: Mucous membranes are moist.  Eyes:     Pupils: Pupils are equal, round, and reactive to light.  Neck:     Vascular: No carotid bruit  or JVD.  Cardiovascular:     Rate and Rhythm: Normal rate and regular rhythm.     Heart sounds: Normal heart sounds.  Pulmonary:     Effort: Pulmonary effort is normal. No respiratory distress.     Breath sounds: Normal breath sounds. No wheezing or rales.  Chest:     Chest wall: No tenderness.  Abdominal:     General: Bowel sounds are normal. There is no distension or abdominal bruit.     Palpations: Abdomen is soft. There is no hepatomegaly, splenomegaly, mass or pulsatile mass.     Tenderness: There is no abdominal tenderness.  Musculoskeletal:        General: Normal range of motion.     Cervical back: Normal range of motion and neck supple.  Lymphadenopathy:     Cervical: No cervical adenopathy.  Skin:    General: Skin is warm and dry.  Neurological:     Mental Status: She is alert and oriented to person, place, and time.     Deep Tendon Reflexes: Reflexes are normal and symmetric.  Psychiatric:        Behavior: Behavior normal.        Thought Content: Thought content normal.        Judgment: Judgment normal.     BP 132/66   Pulse 65   Temp 97.6 F (36.4 C) (Temporal)   Ht 5' 3 (1.6 m)   Wt 164 lb (74.4 kg)   SpO2 95%   BMI 29.05 kg/m         Assessment & Plan:   HELLENA [name] comes in today with chief complaint of medical management of chronic issues    Diagnosis and orders addressed:  1. Hyperlipidemia with target LDL less than 100 Low fat diet - CBC with Differential/Platelet - CMP14+EGFR - Lipid panel  2. Gastroesophageal reflux disease without esophagitis Avoid spicy foods Do not eat 2 hours prior to bedtime   3. Osteopenia of lumbar spine Weight bearing exercise  4. BMI 27.0-27.9,adult Discussed diet and exercise for person with BMI >25 Will recheck weight in 3-6 months    Labs pending Health Maintenance reviewed Diet and exercise encouraged  Follow up plan: 6 months   Mary-Margaret Gladis, FNP

## 2024-03-12 NOTE — Patient Instructions (Signed)
 Bone Health Bones protect organs, store calcium, anchor muscles, and support the whole body. Keeping your bones strong is important, especially as you get older. You can take actions to help keep your bones strong and healthy. Why is keeping my bones healthy important?  Keeping your bones healthy is important because your body constantly replaces bone cells. Cells get old, and new cells take their place. As we age, we lose bone cells because the body may not be able to make enough new cells to replace the old cells. The amount of bone cells and bone tissue you have is referred to as bone mass. The higher your bone mass, the stronger your bones. The aging process leads to an overall loss of bone mass in the body, which can increase the likelihood of: Broken bones. A condition in which the bones become weak and brittle (osteoporosis). A large decline in bone mass occurs in older adults. In women, it occurs about the time of menopause. What actions can I take to keep my bones healthy? Good health habits are important for maintaining healthy bones. This includes eating nutritious foods and exercising regularly. To have healthy bones, you need to get enough of the right minerals and vitamins. Most nutrition experts recommend getting these nutrients from the foods that you eat. In some cases, taking supplements may also be recommended. Doing certain types of exercise is also important for bone health. What are the nutritional recommendations for healthy bones?  Eating a well-balanced diet with plenty of calcium and vitamin D will help to protect your bones. Nutritional recommendations vary from person to person. Ask your health care provider what is healthy for you. Here are some general guidelines. Get enough calcium Calcium is the most important (essential) mineral for bone health. Most people can get enough calcium from their diet, but supplements may be recommended for people who are at risk for  osteoporosis. Good sources of calcium include: Dairy products, such as low-fat or nonfat milk, cheese, and yogurt. Dark green leafy vegetables, such as bok choy and broccoli. Foods that have calcium added to them (are fortified). Foods that may be fortified with calcium include orange juice, cereal, bread, soy beverages, and tofu products. Nuts, such as almonds. Follow these recommended amounts for daily calcium intake: Infants, 0-6 months: 200 mg. Infants, 6-12 months: 260 mg. Children, age 647-3: 700 mg. Children, age 64-8: 1,000 mg. Children, age 642-13: 1,300 mg. Teens, age 38-18: 1,300 mg. Adults, age 73-50: 1,000 mg. Adults, age 23-70: Men: 1,000 mg. Women: 1,200 mg. Adults, age 97 or older: 1,200 mg. Pregnant and breastfeeding females: Teens: 1,300 mg. Adults: 1,000 mg. Get enough vitamin D Vitamin D is the most essential vitamin for bone health. It helps the body absorb calcium. Sunlight stimulates the skin to make vitamin D, so be sure to get enough sunlight. If you live in a cold climate or you do not get outside often, your health care provider may recommend that you take vitamin D supplements. Good sources of vitamin D in your diet include: Egg yolks. Saltwater fish. Milk and cereal fortified with vitamin D. Follow these recommended amounts for daily vitamin D intake: Infants, 0-12 months: 400 international units (IU). Children and teens, age 647-18: 600 international units. Adults, age 31 or younger: 600 international units. Adults, age 9 or older: 600-1,000 international units. Get other important nutrients Other nutrients that are important for bone health include: Phosphorus. This mineral is found in meat, poultry, dairy foods, nuts, and legumes. The  recommended daily intake for adult men and adult women is 700 mg. Magnesium. This mineral is found in seeds, nuts, dark green vegetables, and legumes. The recommended daily intake for adult men is 400-420 mg. For adult women,  it is 310-320 mg. Vitamin K. This vitamin is found in green leafy vegetables. The recommended daily intake is 120 mcg for adult men and 90 mcg for adult women. What type of physical activity is best for building and maintaining healthy bones? Weight-bearing and strength-building activities are important for building and maintaining healthy bones. Weight-bearing activities cause muscles and bones to work against gravity. Strength-building activities increase the strength of the muscles that support bones. Weight-bearing and muscle-building activities include: Walking and hiking. Jogging and running. Dancing. Gym exercises. Lifting weights. Tennis and racquetball. Climbing stairs. Aerobics. Adults should get at least 30 minutes of moderate physical activity on most days. Children should get at least 60 minutes of moderate physical activity on most days. Ask your health care provider what type of exercise is best for you. How can I find out if my bone mass is low? Bone mass can be measured with an X-ray test called a bone mineral density (BMD) test. This test is recommended for all women who are age 19 or older. It may also be recommended for: Men who are age 55 or older. People who are at risk for osteoporosis because of: Having a long-term disease that weakens bones, such as kidney disease or rheumatoid arthritis. Having menopause earlier than normal. Taking medicine that weakens bones, such as steroids, thyroid hormones, or hormone treatment for breast cancer or prostate cancer. Smoking. Drinking three or more alcoholic drinks a day. Being underweight. Sedentary lifestyle. If you find that you have a low bone mass, you may be able to prevent osteoporosis or further bone loss by changing your diet and lifestyle. Where can I find more information? [employer] Health [employer] Osteoporosis Foundation: https://carlson-fletcher.info/ [employer]: www.bones.http://www.myers.net/ International Osteoporosis  Foundation: [employer], operational.iofbonehealth.org Summary The aging process leads to an overall loss of bone mass in the body, which can increase the likelihood of broken bones and osteoporosis. Eating a well-balanced diet with plenty of calcium and vitamin D will help to protect your bones. Weight-bearing and strength-building activities are also important for building and maintaining strong bones. Bone mass can be measured with an X-ray test called a bone mineral density (BMD) test. This information is not intended to replace advice given to you by your health care provider. Make sure you discuss any questions you have with your health care provider. Document Revised: 11/26/2020 Document Reviewed: 11/26/2020 Elsevier Patient Education  2024 [employer].

## 2024-03-13 ENCOUNTER — Ambulatory Visit: Payer: Self-pay | Admitting: Nurse Practitioner

## 2024-03-13 LAB — LIPID PANEL
Chol/HDL Ratio: 3.1 ratio (ref 0.0–4.4)
Cholesterol, Total: 145 mg/dL (ref 100–199)
HDL: 47 mg/dL (ref >39)
LDL Chol Calc (NIH): 77 mg/dL (ref 0–99)
Triglycerides: 118 mg/dL (ref 0–149)
VLDL Cholesterol Cal: 21 mg/dL (ref 5–40)

## 2024-03-13 LAB — CMP14+EGFR
ALT: 23 IU/L (ref 0–32)
AST: 26 IU/L (ref 0–40)
Albumin: 4 g/dL (ref 3.8–4.8)
Alkaline Phosphatase: 90 IU/L (ref 51–125)
BUN/Creatinine Ratio: 20 (ref 12–28)
BUN: 17 mg/dL (ref 8–27)
Bilirubin Total: 0.5 mg/dL (ref 0.0–1.2)
CO2: 23 mmol/L (ref 20–29)
Calcium: 9.2 mg/dL (ref 8.7–10.3)
Chloride: 103 mmol/L (ref 96–106)
Creatinine, Ser: 0.84 mg/dL (ref 0.57–1.00)
Globulin, Total: 2.2 g/dL (ref 1.5–4.5)
Glucose: 92 mg/dL (ref 70–99)
Potassium: 5.2 mmol/L (ref 3.5–5.2)
Sodium: 140 mmol/L (ref 134–144)
Total Protein: 6.2 g/dL (ref 6.0–8.5)
eGFR: 74 mL/min/1.73 (ref >59)

## 2024-03-16 ENCOUNTER — Ambulatory Visit: Admitting: Nurse Practitioner

## 2024-03-21 DIAGNOSIS — I781 Nevus, non-neoplastic: Secondary | ICD-10-CM | POA: Diagnosis not present

## 2024-03-21 DIAGNOSIS — L573 Poikiloderma of Civatte: Secondary | ICD-10-CM | POA: Diagnosis not present

## 2024-03-21 DIAGNOSIS — L570 Actinic keratosis: Secondary | ICD-10-CM | POA: Diagnosis not present

## 2024-03-21 DIAGNOSIS — Z85828 Personal history of other malignant neoplasm of skin: Secondary | ICD-10-CM | POA: Diagnosis not present

## 2024-03-22 ENCOUNTER — Other Ambulatory Visit: Payer: Self-pay | Admitting: Nurse Practitioner

## 2024-04-09 DIAGNOSIS — H5203 Hypermetropia, bilateral: Secondary | ICD-10-CM | POA: Diagnosis not present

## 2024-04-09 DIAGNOSIS — H524 Presbyopia: Secondary | ICD-10-CM | POA: Diagnosis not present

## 2024-04-09 DIAGNOSIS — H52223 Regular astigmatism, bilateral: Secondary | ICD-10-CM | POA: Diagnosis not present

## 2024-04-09 DIAGNOSIS — H2513 Age-related nuclear cataract, bilateral: Secondary | ICD-10-CM | POA: Diagnosis not present

## 2024-05-08 DIAGNOSIS — Z1231 Encounter for screening mammogram for malignant neoplasm of breast: Secondary | ICD-10-CM | POA: Diagnosis not present

## 2024-05-08 LAB — HM MAMMOGRAPHY

## 2024-05-10 ENCOUNTER — Encounter (INDEPENDENT_AMBULATORY_CARE_PROVIDER_SITE_OTHER): Payer: Self-pay | Admitting: *Deleted

## 2024-09-06 ENCOUNTER — Ambulatory Visit: Payer: Self-pay | Admitting: Nurse Practitioner

## 2024-10-09 ENCOUNTER — Ambulatory Visit
